# Patient Record
Sex: Female | Born: 2000 | Race: White | Hispanic: No | Marital: Single | Smoking: Never smoker
Health system: Southern US, Community
[De-identification: ages and names within clinical notes are randomized; demographics above are authoritative.]

## PROBLEM LIST (undated history)

## (undated) HISTORY — PX: NO PAST SURGERIES: SHX2092

---
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
## 2021-06-16 ENCOUNTER — Ambulatory Visit: Payer: BC Managed Care – PPO | Admitting: Family Medicine

## 2021-07-19 ENCOUNTER — Other Ambulatory Visit: Payer: Self-pay

## 2021-07-19 ENCOUNTER — Encounter: Payer: Self-pay | Admitting: Family Medicine

## 2021-07-19 ENCOUNTER — Ambulatory Visit: Payer: BC Managed Care – PPO | Admitting: Family Medicine

## 2021-07-19 DIAGNOSIS — F902 Attention-deficit hyperactivity disorder, combined type: Secondary | ICD-10-CM

## 2021-07-19 MED ORDER — AMPHETAMINE-DEXTROAMPHETAMINE 10 MG PO TABS: 10.0000 mg | ORAL_TABLET | Freq: Two times a day (BID) | ORAL | 0 refills | Status: DC

## 2021-07-19 NOTE — Progress Notes (Signed)
? ? Patient ID: Andrea Bender, female    DOB: [birth year], 21 y.o.   MRN: [id number] ? ?This visit was conducted in person. ? ?BP 100/64   Pulse 84   Temp 98.3 ?F (36.8 ?C) (Skin)   Ht 5\' 8"  (1.727 m)   Wt 145 lb 8 oz (66 kg)   LMP 07/18/2021   BMI 22.12 kg/m?   ? ?CC: ADHD f/u visit  ?Subjective:  ? ?HPI: ?Andrea Bender is a 21 y.o. female presenting on 07/19/2021 for Follow-up (On medication) ? ? ?ADHD dx by psychology (Altabet 10/2020) ?Current regimen is adderall IR 10mg  BID. Regularly takes 10mg  BID - more regularly in PM as per below.  ? ?Tolerating medication without headache, anorexia, chest pain, abd pain, tics or mood changes.  ? ?Continues working at 11/2020 by and really likes this.  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 Enjoying this as well.  ? ?Decided to take the year off from college but now planning to take summer classes through [employer] starting this summer.  ?   ? ?Relevant past medical, surgical, family and social history reviewed and updated as indicated. Interim medical history since our last visit reviewed. ?Allergies and medications reviewed and updated. ?Outpatient Medications Prior to Visit  ?Medication Sig Dispense Refill  ? Clindamycin-Benzoyl Per, Refr, gel APPLY TO AFFECTED AREA EVERY DAY    ? JUNEL FE 1.5/30 1.5-30 MG-MCG tablet Take 1 tablet by mouth every morning.    ? Multiple Vitamin (MULTIVITAMIN) tablet Take 1 tablet by mouth daily.    ? tretinoin (RETIN-A) 0.025 % cream APPLY TO AFFECTED AREA EVERY DAY AT NIGHT    ? amphetamine-dextroamphetamine (ADDERALL) 10 MG tablet Take 1 tablet (10 mg total) by mouth 2 (two) times daily as needed (ADHD). 60 tablet 0  ? amphetamine-dextroamphetamine (ADDERALL) 10 MG tablet Take 1 tablet (10 mg total) by mouth 2 (two) times daily as needed (ADHD). 60 tablet 0  ? amphetamine-dextroamphetamine (ADDERALL) 10 MG tablet Take 1 tablet (10 mg total) by mouth 2 (two) times daily as  needed (ADHD). 60 tablet 0  ? ?No facility-administered medications prior to visit.  ?  ? ?Per HPI unless specifically indicated in ROS section below ?Review of Systems ? ?Objective:  ?BP 100/64   Pulse 84   Temp 98.3 ?F (36.8 ?C) (Skin)   Ht 5\' 8"  (1.727 m)   Wt 145 lb 8 oz (66 kg)   LMP 07/18/2021   BMI 22.12 kg/m?   ?Wt Readings from Last 3 Encounters:  ?07/19/21 145 lb 8 oz (66 kg) (75 %, Z= 0.69)*  ?12/11/20 139 lb 8 oz (63.3 kg) (70 %, Z= 0.52)*  ?11/20/20 138 lb 5 oz (62.7 kg) (69 %, Z= 0.49)*  ? ?* Growth percentiles are based on CDC (Girls, 2-20 Years) data.  ?  ?  ?Physical Exam ?Vitals and nursing note reviewed.  ?Constitutional:   ?   Appearance: Normal appearance. She is not ill-appearing.  ?Cardiovascular:  ?   Rate and Rhythm: Normal rate and regular rhythm.  ?   Pulses: Normal pulses.  ?   Heart sounds: Normal heart sounds. No murmur heard. ?Pulmonary:  ?   Effort: Pulmonary effort is normal. No respiratory distress.  ?   Breath sounds: Normal breath sounds. No wheezing, rhonchi or rales.  ?Musculoskeletal:  ?   Right lower leg: No edema.  ?   Left lower leg: No edema.  ?Skin: ?  General: Skin is warm and dry.  ?   Findings: No rash.  ?Neurological:  ?   Mental Status: She is alert.  ?Psychiatric:     ?   Mood and Affect: Mood normal.     ?   Behavior: Behavior normal.  ? ?   ?Results for orders placed or performed in visit on 10/20/20  ?DRUG MONITORING, PANEL 8 WITH CONFIRMATION, URINE  ?Result Value Ref Range  ? Alcohol Metabolites NEGATIVE ng/mL  ? Amphetamines NEGATIVE <500 ng/mL  ? Benzodiazepines NEGATIVE <100 ng/mL  ? Buprenorphine, Urine NEGATIVE <5 ng/mL  ? Cocaine Metabolite NEGATIVE <150 ng/mL  ? 6 Acetylmorphine NEGATIVE <10 ng/mL  ? Marijuana Metabolite NEGATIVE <20 ng/mL  ? MDMA NEGATIVE <500 ng/mL  ? Opiates NEGATIVE <100 ng/mL  ? Oxycodone NEGATIVE <100 ng/mL  ? Creatinine 229.6 mg/dL  ? pH 7.2 4.5 - 9.0  ? Oxidant NEGATIVE mcg/mL  ?DM TEMPLATE  ?Result Value Ref Range  ? Notes  and Comments    ? ? ?Assessment & Plan:  ?This visit occurred during the SARS-CoV-2 public health emergency.  Safety protocols were in place, including screening questions prior to the visit, additional usage of staff PPE, and extensive cleaning of exam room while observing appropriate contact time as indicated for disinfecting solutions.  ? ?Problem List Items Addressed This Visit   ? ? ADHD (attention deficit hyperactivity disorder), combined type  ?  Stable period on adderall IR 10mg  bid, using regularly, tolerating well, and benefiting from medication.  ?She requests sooner visit than 6 months as she will be starting summer classes in a few months and wants to ensure she is on therapeutic dose of stimulant. This is very reasonable. I asked her to return in 3-4 months about 1 wk after starting summer classes.  ?  ?  ?  ? ?Meds ordered this encounter  ?Medications  ? amphetamine-dextroamphetamine (ADDERALL) 10 MG tablet  ?  Sig: Take 1 tablet (10 mg total) by mouth 2 (two) times daily with a meal.  ?  Dispense:  60 tablet  ?  Refill:  0  ? amphetamine-dextroamphetamine (ADDERALL) 10 MG tablet  ?  Sig: Take 1 tablet (10 mg total) by mouth 2 (two) times daily with a meal.  ?  Dispense:  60 tablet  ?  Refill:  0  ? amphetamine-dextroamphetamine (ADDERALL) 10 MG tablet  ?  Sig: Take 1 tablet (10 mg total) by mouth 2 (two) times daily with a meal.  ?  Dispense:  60 tablet  ?  Refill:  0  ? ?No orders of the defined types were placed in this encounter. ? ? ? ?Patient Instructions  ?You are doing well today. Continue adderall 10mg  twice daily.  ?Schedule follow up visit in 3-4 months - around 1 week after starting summer courses to touch base.  ? ?Follow up plan: ?Return in about 3 months (around 10/19/2021) for follow up visit. ? ? , MD   ?

## 2021-07-19 NOTE — Patient Instructions (Signed)
You are doing well today. Continue adderall 10mg  twice daily.  ?Schedule follow up visit in 3-4 months - around 1 week after starting summer courses to touch base.  ?

## 2021-07-19 NOTE — Assessment & Plan Note (Addendum)
Stable period on adderall IR 10mg  bid, using regularly, tolerating well, and benefiting from medication.  ?She requests sooner visit than 6 months as she will be starting summer classes in a few months and wants to ensure she is on therapeutic dose of stimulant. This is very reasonable. I asked her to return in 3-4 months about 1 wk after starting summer classes.  ?

## 2021-09-21 IMAGING — DX DG ANKLE COMPLETE 3+V*L*
3 series · 3 of 3 positions shown · non-contrast
Comparison: None.

CLINICAL DATA: Left ankle injury, initial encounter. Turned ankle 2
weeks ago.

EXAM:
LEFT ANKLE COMPLETE - 3+ VIEW

[ankle ap]
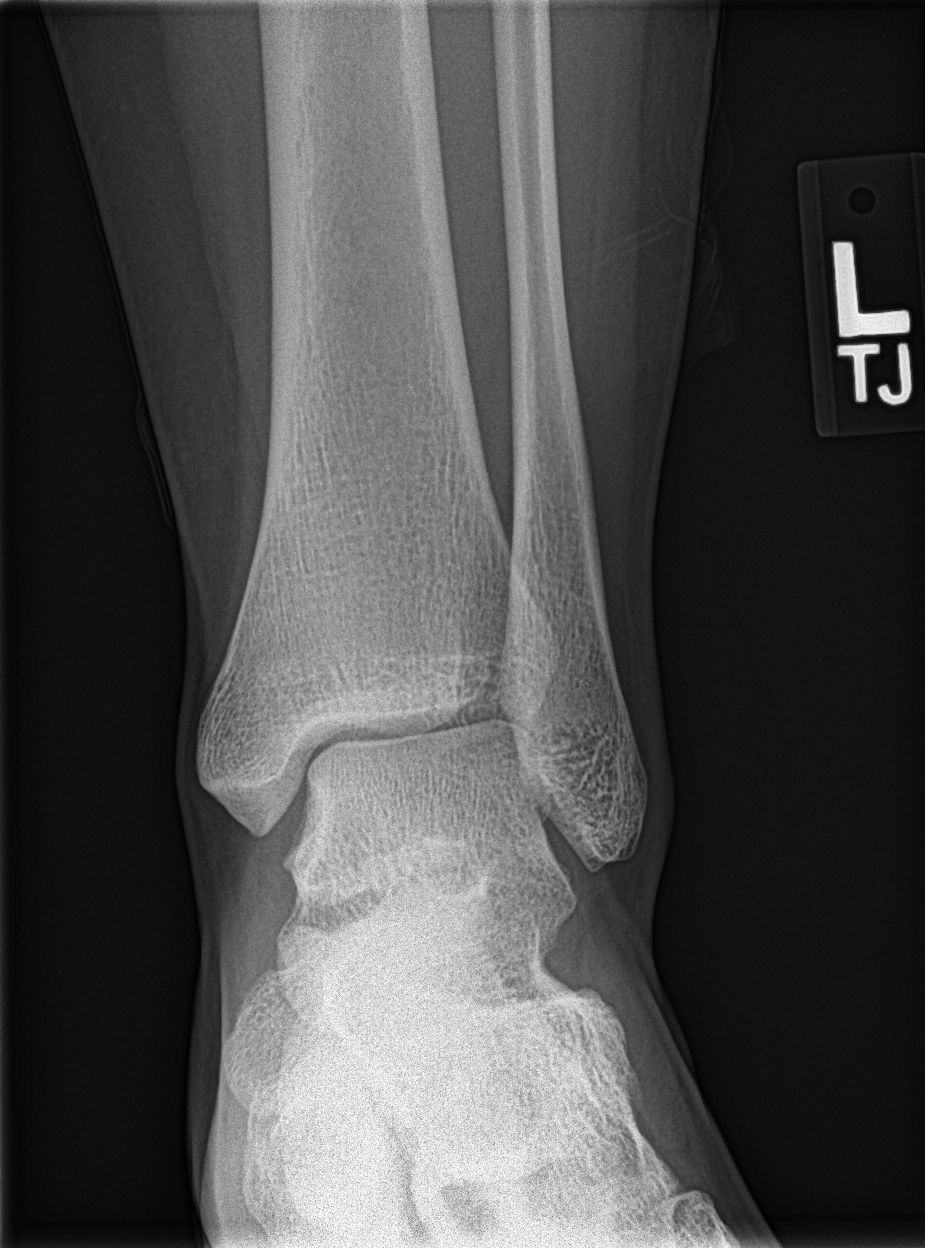

[ankle mortise]
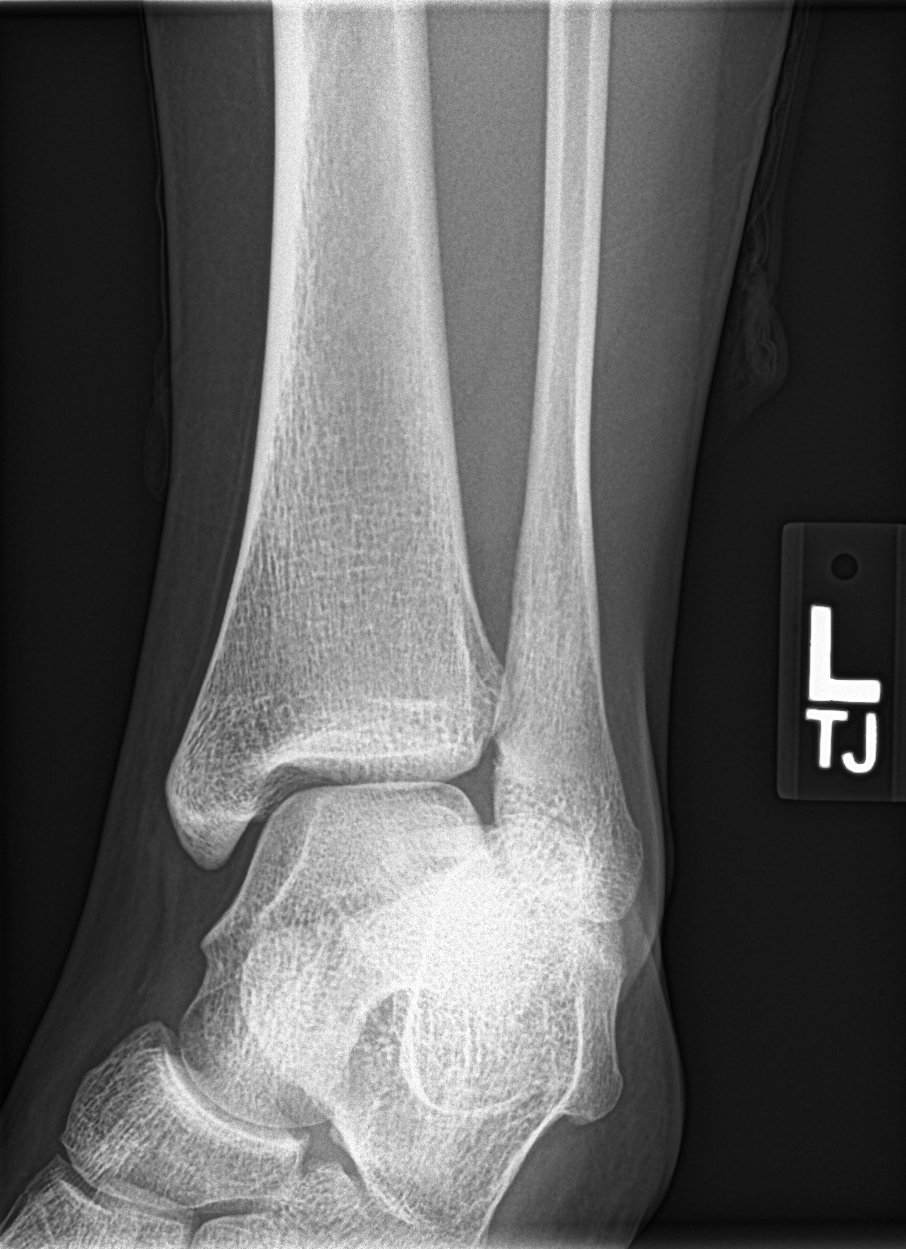

[ankle lat]
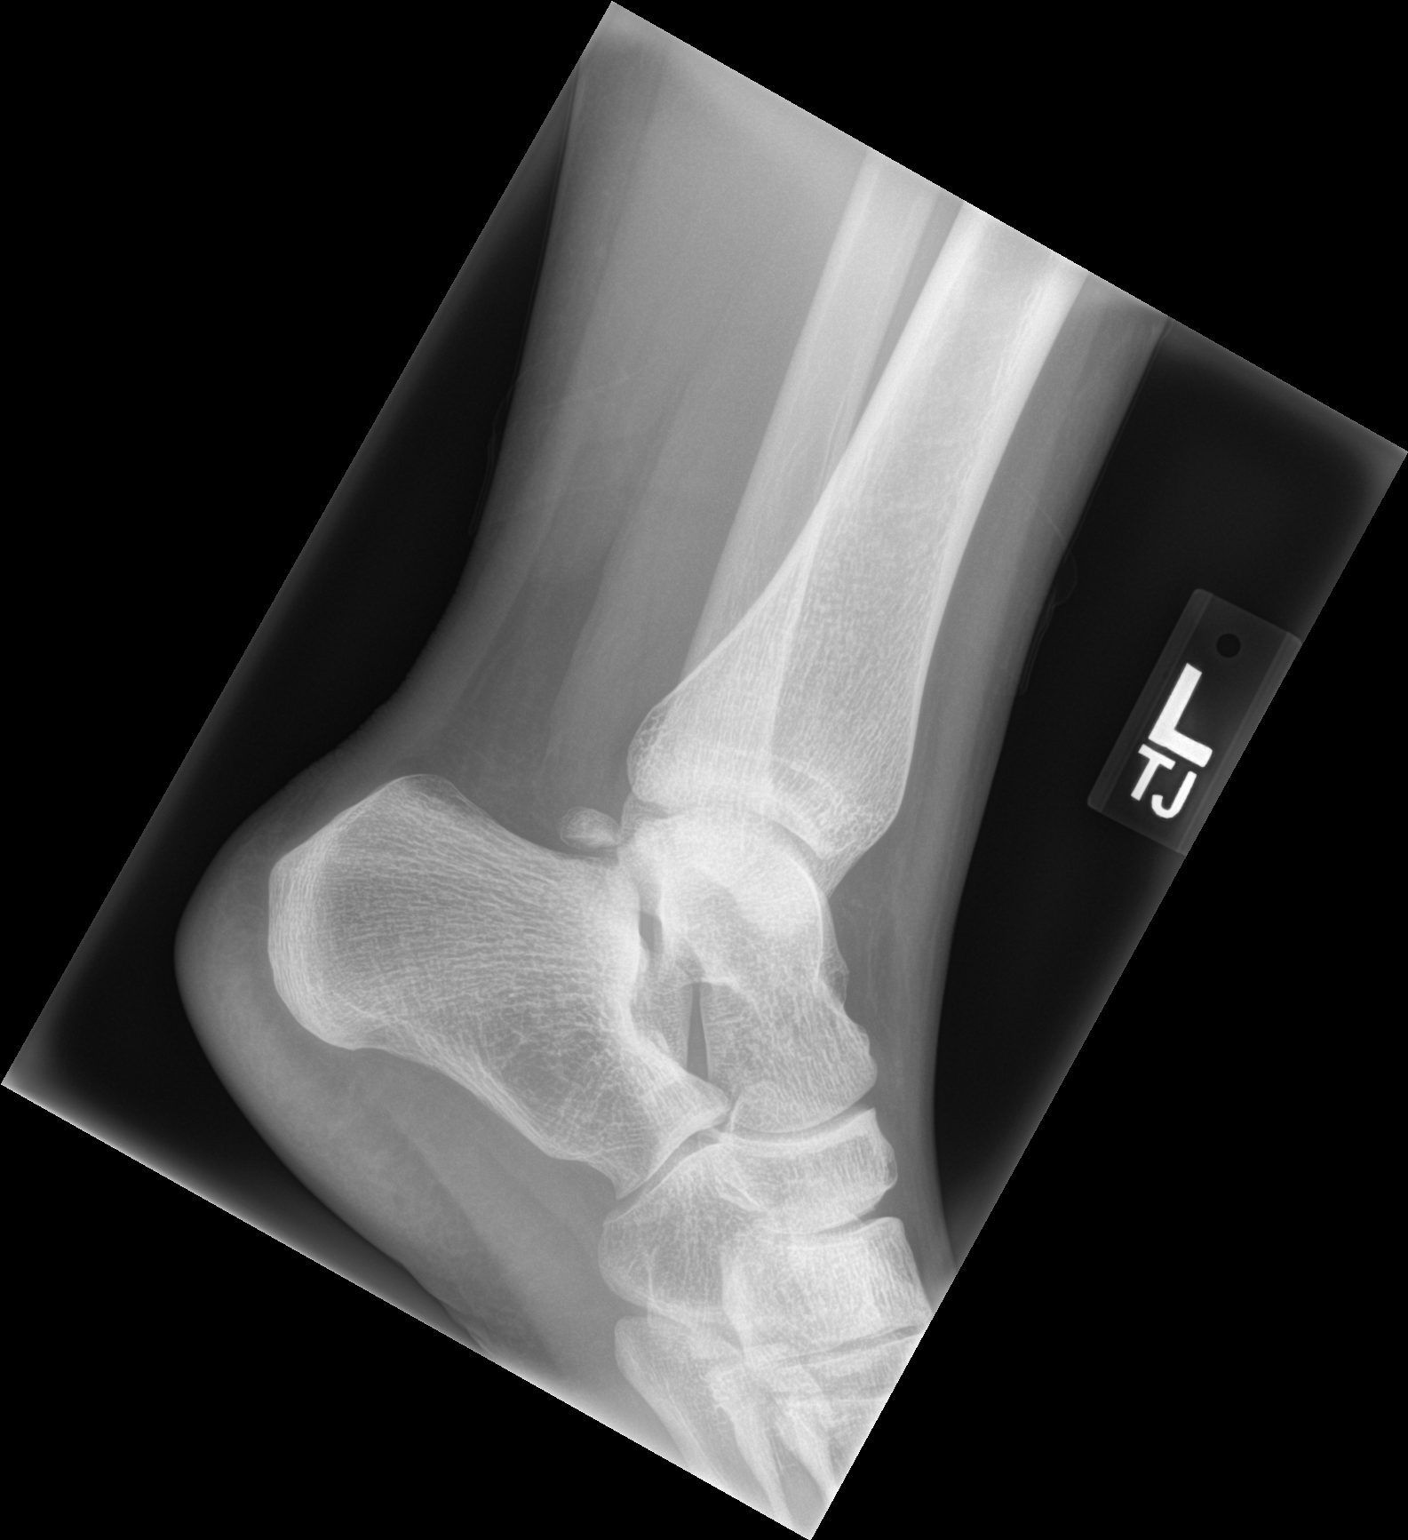

[3 of 3 positions shown; findings below may reference images not displayed]

FINDINGS: There is no evidence of fracture, dislocation, or joint effusion.
There is an os trigonum. There is no evidence of arthropathy or
other focal bone abnormality. The ankle mortise is preserved. Soft
tissues are unremarkable.
IMPRESSION: Normal radiographs of the left ankle.

## 2021-10-25 ENCOUNTER — Ambulatory Visit: Payer: BC Managed Care – PPO | Admitting: Family Medicine

## 2021-10-29 ENCOUNTER — Ambulatory Visit: Payer: BC Managed Care – PPO | Admitting: Family Medicine

## 2021-10-29 ENCOUNTER — Encounter: Payer: Self-pay | Admitting: Family Medicine

## 2021-10-29 DIAGNOSIS — F902 Attention-deficit hyperactivity disorder, combined type: Secondary | ICD-10-CM | POA: Diagnosis not present

## 2021-10-29 MED ORDER — AMPHETAMINE-DEXTROAMPHETAMINE 15 MG PO TABS: 15.0000 mg | ORAL_TABLET | Freq: Two times a day (BID) | ORAL | 0 refills | Status: DC | PRN

## 2022-01-24 ENCOUNTER — Other Ambulatory Visit: Payer: Self-pay

## 2022-01-24 NOTE — Telephone Encounter (Signed)
Name of Medication: adderall 15 mg Name of Pharmacy: [facility] Last Fill or Written Date and Quantity: # 45 on 10/29/21 Last Office Visit and Type: 10/29/21 ADHD Next Office Visit and Type: 01/31/22 for 3 mth FU Last Controlled Substance Agreement Date: 10/23/2020 Last UDS:10/20/20

## 2022-01-26 MED ORDER — AMPHETAMINE-DEXTROAMPHETAMINE 15 MG PO TABS: 15.0000 mg | ORAL_TABLET | Freq: Two times a day (BID) | ORAL | 0 refills | Status: DC | PRN

## 2022-01-26 NOTE — Telephone Encounter (Signed)
ERx 

## 2022-01-27 NOTE — Telephone Encounter (Signed)
Unable to reach pt by phone. Sending note to [facility].

## 2022-01-31 ENCOUNTER — Encounter: Payer: Self-pay | Admitting: Family Medicine

## 2022-01-31 ENCOUNTER — Ambulatory Visit: Payer: BC Managed Care – PPO | Admitting: Family Medicine

## 2022-01-31 VITALS — BP 116/68 | HR 88 | Temp 98.2°F | Ht 68.0 in | Wt 140.1 lb

## 2022-01-31 DIAGNOSIS — F902 Attention-deficit hyperactivity disorder, combined type: Secondary | ICD-10-CM

## 2022-01-31 MED ORDER — AMPHETAMINE-DEXTROAMPHETAMINE 15 MG PO TABS: 15.0000 mg | ORAL_TABLET | Freq: Every day | ORAL | 0 refills | Status: DC

## 2022-01-31 NOTE — Assessment & Plan Note (Signed)
Chronic, stable period on current regimen of adderall 15mg  BID PRN. Takes BID on week days and usually only once daily on weekends. Tolerating well without significant side effects. Will continue this, 3 months worth of medication sent to pharmacy.  RTC 6 mo for physical. Consider updated UDS at that time.

## 2022-01-31 NOTE — Patient Instructions (Addendum)
You are doing well today  Continue adderall 15mg  twice daily as needed - 3 month supply available at pharmacy.  Return as needed or in 6 months for physical.

## 2022-01-31 NOTE — Progress Notes (Signed)
Patient ID: Andrea Bender, female    DOB: 2000/07/02, 21 y.o.   MRN: [id number]  This visit was conducted in person.  BP 116/68   Pulse 88   Temp 98.2 F (36.8 C) (Temporal)   Ht 5\' 8"  (1.727 m)   Wt 140 lb 2 oz (63.6 kg)   LMP 01/31/2022   SpO2 96%   BMI 21.31 kg/m    CC: ADHD f/u  Subjective:   HPI: Andrea Bender is a 21 y.o. female presenting on 01/31/2022 for ADHD (Here for 3 mo f/u.)   Diagnosed by psychological testing (Altabet 10/2020) - ADHD combined along with GAD. Rec at that time - consider medication and individual counseling. She previously saw therapist, didn't feel it was effective (may have been a bad match).  On adderall since last year, current dose is 15mg  BID PRN (last refill 01/24/2022). Tolerating medication well without dry mouth, insomnia, irritability, dysphoria, diminished appetite or weight loss, headache, tics.  Took last year off academically, now has restarted school and doing well. Goes to [employer] [place] [employer].   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      Relevant past medical, surgical, family and social history reviewed and updated as indicated. Interim medical history since our last visit reviewed. Allergies and medications reviewed and updated. Outpatient Medications Prior to Visit  Medication Sig Dispense Refill   amphetamine-dextroamphetamine (ADDERALL) 15 MG tablet Take 1 tablet by mouth 2 (two) times daily as needed (ADHD). 60 tablet 0   Clindamycin-Benzoyl Per, Refr, gel APPLY TO AFFECTED AREA EVERY DAY     JUNEL FE 1.5/30 1.5-30 MG-MCG tablet Take 1 tablet by mouth every morning.     Multiple Vitamin (MULTIVITAMIN) tablet Take 1 tablet by mouth daily.     No facility-administered medications prior to visit.     Per HPI unless specifically indicated in ROS section below Review of Systems  Objective:  BP 116/68   Pulse 88   Temp 98.2 F (36.8  C) (Temporal)   Ht 5\' 8"  (1.727 m)   Wt 140 lb 2 oz (63.6 kg)   LMP 01/31/2022   SpO2 96%   BMI 21.31 kg/m   Wt Readings from Last 3 Encounters:  01/31/22 140 lb 2 oz (63.6 kg)  10/29/21 147 lb (66.7 kg)  07/19/21 145 lb 8 oz (66 kg) (75 %, Z= 0.69)*   * Growth percentiles are based on CDC (Girls, 2-20 Years) data.      Physical Exam Vitals and nursing note reviewed.  Constitutional:      Appearance: Normal appearance. She is not ill-appearing.  HENT:     Head: Normocephalic and atraumatic.     Mouth/Throat:     Mouth: Mucous membranes are moist.     Pharynx: Oropharynx is clear. No oropharyngeal exudate or posterior oropharyngeal erythema.  Eyes:     Extraocular Movements: Extraocular movements intact.     Pupils: Pupils are equal, round, and reactive to light.  Cardiovascular:     Rate and Rhythm: Normal rate and regular rhythm.     Pulses: Normal pulses.     Heart sounds: Normal heart sounds. No murmur heard. Pulmonary:     Effort: Pulmonary effort is normal. No respiratory distress.     Breath sounds: Normal breath sounds. No wheezing, rhonchi or rales.  Musculoskeletal:     Right lower leg: No edema.     Left lower  leg: No edema.  Skin:    General: Skin is warm and dry.     Findings: No rash.  Neurological:     Mental Status: She is alert.  Psychiatric:        Mood and Affect: Mood normal.        Behavior: Behavior normal.        Assessment & Plan:   Problem List Items Addressed This Visit     ADHD (attention deficit hyperactivity disorder), combined type - Primary    Chronic, stable period on current regimen of adderall 15mg  BID PRN. Takes BID on week days and usually only once daily on weekends. Tolerating well without significant side effects. Will continue this, 3 months worth of medication sent to pharmacy.  RTC 6 mo for physical. Consider updated UDS at that time.         Meds ordered this encounter  Medications   amphetamine-dextroamphetamine  (ADDERALL) 15 MG tablet    Sig: Take 1 tablet by mouth daily.    Dispense:  60 tablet    Refill:  0   amphetamine-dextroamphetamine (ADDERALL) 15 MG tablet    Sig: Take 1 tablet by mouth daily.    Dispense:  60 tablet    Refill:  0   No orders of the defined types were placed in this encounter.    Patient Instructions  You are doing well today  Continue adderall 15mg  twice daily as needed - 3 month supply available at pharmacy.  Return as needed or in 6 months for physical.   Follow up plan: Return in about 6 months (around 08/01/2022) for annual exam.  Ria Bush, MD

## 2022-02-03 ENCOUNTER — Other Ambulatory Visit: Payer: Self-pay

## 2022-02-03 NOTE — Telephone Encounter (Signed)
Received faxed message from [facility] stating they received 2 Adderall rxs:  fill on 01/31/22 (pt has picked up) and fill on 03/02/22.  Says they did not receive rx to fill on 04/02/22.  Requests rx be sent again.

## 2022-02-06 MED ORDER — AMPHETAMINE-DEXTROAMPHETAMINE 15 MG PO TABS: 15.0000 mg | ORAL_TABLET | Freq: Every day | ORAL | 0 refills | Status: DC

## 2022-02-09 NOTE — Telephone Encounter (Signed)
Received fas from pharmacy with following information: Pended correction with fill date of 11/25 do we need to d/c the original script for that date?

## 2022-02-10 MED ORDER — AMPHETAMINE-DEXTROAMPHETAMINE 15 MG PO TABS: 15.0000 mg | ORAL_TABLET | Freq: Two times a day (BID) | ORAL | 0 refills | Status: DC | PRN

## 2022-02-10 NOTE — Telephone Encounter (Signed)
ERx new BID PRN dose.

## 2022-06-06 ENCOUNTER — Encounter: Payer: Self-pay | Admitting: Family Medicine

## 2022-06-06 ENCOUNTER — Ambulatory Visit: Payer: BC Managed Care – PPO | Admitting: Family Medicine

## 2022-06-06 VITALS — BP 90/60 | HR 84 | Temp 98.2°F | Ht 68.0 in | Wt 138.0 lb

## 2022-06-06 DIAGNOSIS — M67951 Unspecified disorder of synovium and tendon, right thigh: Secondary | ICD-10-CM | POA: Diagnosis not present

## 2022-06-06 DIAGNOSIS — M67952 Unspecified disorder of synovium and tendon, left thigh: Secondary | ICD-10-CM

## 2022-06-06 DIAGNOSIS — G8929 Other chronic pain: Secondary | ICD-10-CM

## 2022-06-06 DIAGNOSIS — M25552 Pain in left hip: Secondary | ICD-10-CM | POA: Diagnosis not present

## 2022-06-06 DIAGNOSIS — M25551 Pain in right hip: Secondary | ICD-10-CM | POA: Diagnosis not present

## 2022-06-06 MED ORDER — DICLOFENAC SODIUM 75 MG PO TBEC: 75.0000 mg | DELAYED_RELEASE_TABLET | Freq: Two times a day (BID) | ORAL | 1 refills | Status: DC

## 2022-06-06 NOTE — Progress Notes (Unsigned)
Andrea Bo T. Andrea Huckeba, MD, [place] at [facility] [place], [address]  Phone: [phone]  FAX: [phone]  Andrea Bender - 22 y.o. 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 very pleasant female patient with Body mass index is 20.98 kg/m. who presents with the following:  Pleasant patient presents with hip pain.  I saw her about 2 and half years ago with hip pain, she presents today with hip pain.  At the [place] the last time I thought she could have a labral pathology, but today she does not have any anterior groin pain or groin pain with rotational maneuvers.  I did give her some home rehab, and at that point I have not seen her again.  She is not entirely sure about what is going on with her hip.  Did not bother her at all in December.  A few weeks ago, she had some severe pain.  It has been variable, affecting both the right and left hip.  At times it is severe, at times is not present at all.  It will switch from R and L.  Walks with a limp most of the time.  Sitting down will hurt some.   Walking around does help.  Has been ongoing for a year and a half.  DId not bother her until after she stopped dancing.    Will take 2 ibuprofen at times at work.   She is not particularly physically active currently.  Review of Systems is noted in the HPI, as appropriate  Objective:   BP 90/60   Pulse 84   Temp 98.2 F (36.8 C) (Temporal)   Ht 5\' 8"  (1.727 m)   Wt 138 lb (62.6 kg)   LMP 05/28/2022   SpO2 98%   BMI 20.98 kg/m   GEN: No acute distress; alert,appropriate. PULM: Breathing comfortably in no respiratory distress PSYCH: Normally interactive.    HIP EXAM: SIDE:  B ROM: Abduction, Flexion, Internal and External range of motion: Full Pain with terminal IROM and EROM: Modest tenderness at the lateral aspect of the hip caudal to the trochanteric bursa with terminal motion.  There is no groin pain. FADIR is negative GTB: NT, minimally tender on the right SLR: NEG Knees: No effusion FABER: NT REVERSE FABER: NT, neg Piriformis: NT at direct palpation Str: flexion: 5/5 abduction: 5/5 adduction: 5/5 Strength testing non-tender    Laboratory and Imaging Data:  Assessment and Plan:     ICD-10-CM   1. Chronic hip pain, bilateral  M25.551 Ambulatory referral to Physical Therapy   M25.552    G89.29     2. Tendinopathy of right gluteus medius  M67.951 Ambulatory referral to Physical Therapy    3. Tendinopathy of left gluteus medius  M67.952 Ambulatory referral to Physical Therapy     Acute on chronic hip pain with exacerbation.  Think that this is predominantly gluteus medius and gluteus minimus chronic tendinopathy.  It localizes very much to this on exam.  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  I gave her a basic home rehab program to start now, and I am also going  to have her do some formal physical therapy.  Medication Management during today's office visit: Meds ordered this encounter  Medications   diclofenac (VOLTAREN) 75 MG EC tablet    Sig: Take 1 tablet (75 mg total) by mouth 2 (two) times daily.    Dispense:  60 tablet    Refill:  1   Medications Discontinued During This Encounter  Medication Reason   JUNEL FE 1.5/30 1.5-30 MG-MCG tablet Completed Course    Orders placed today for conditions managed today: Orders Placed This Encounter  Procedures   Ambulatory referral to Physical Therapy    Disposition: No follow-ups on file.  Dragon [employer] One speech-to-text software was used for transcription in this dictation.  Possible transcriptional errors can occur  using Editor, commissioning.   Signed,  Andrea Deed. Sheena Donegan, MD   Outpatient Encounter Medications as of 06/06/2022  Medication Sig   amphetamine-dextroamphetamine (ADDERALL) 15 MG tablet Take 1 tablet by mouth 2 (two) times daily as needed (ADHD).   amphetamine-dextroamphetamine (ADDERALL) 15 MG tablet Take 1 tablet by mouth 2 (two) times daily as needed (ADHD).   amphetamine-dextroamphetamine (ADDERALL) 15 MG tablet Take 1 tablet by mouth 2 (two) times daily as needed (ADHD).   Clindamycin-Benzoyl Per, Refr, gel APPLY TO AFFECTED AREA EVERY DAY   diclofenac (VOLTAREN) 75 MG EC tablet Take 1 tablet (75 mg total) by mouth 2 (two) times daily.   Multiple Vitamin (MULTIVITAMIN) tablet Take 1 tablet by mouth daily.   [DISCONTINUED] JUNEL FE 1.5/30 1.5-30 MG-MCG tablet Take 1 tablet by mouth every morning.   No facility-administered encounter medications on file as of 06/06/2022.

## 2022-06-06 NOTE — Patient Instructions (Signed)
Hip Rehab:  Hip Flexion: Toe up to ceiling, laying on your back. Lift your whole leg, 3 sets. Work up to being able to do #30 with each set.  Hip elevations, Toe and leg turned out to side.  Lift whole leg, 3 sets. Work up to being able to do #30 with each set.  Hip Abductions: Lying on side, straight out to side. 3 sets, work up to being able to do #30 with each set.  At the beginning you may only be able to do a lot less, try to do #10.  

## 2022-06-07 ENCOUNTER — Encounter: Payer: Self-pay | Admitting: Family Medicine

## 2022-06-13 ENCOUNTER — Other Ambulatory Visit: Payer: Self-pay

## 2022-06-13 ENCOUNTER — Encounter: Payer: Self-pay | Admitting: Physical Therapy

## 2022-06-13 ENCOUNTER — Ambulatory Visit: Payer: BC Managed Care – PPO | Admitting: Physical Therapy

## 2022-06-13 DIAGNOSIS — M25551 Pain in right hip: Secondary | ICD-10-CM

## 2022-06-13 DIAGNOSIS — M6281 Muscle weakness (generalized): Secondary | ICD-10-CM

## 2022-06-13 DIAGNOSIS — R262 Difficulty in walking, not elsewhere classified: Secondary | ICD-10-CM | POA: Diagnosis not present

## 2022-06-13 DIAGNOSIS — M25552 Pain in left hip: Secondary | ICD-10-CM

## 2022-06-13 NOTE — Therapy (Addendum)
OUTPATIENT PHYSICAL THERAPY LOWER EXTREMITY EVALUATION DISCHARGE   Patient Name: Andrea Bender MRN: [id number] DOB:[birth year], 22 y.o., female Today's Date: 06/13/2022  END OF SESSION:  PT End of Session - 06/13/22 0818     Visit Number 1    Number of Visits 10    Date for PT Re-Evaluation 08/26/22    PT Start Time 0805    PT Stop Time 0845    PT Time Calculation (min) 40 min    Activity Tolerance Patient tolerated treatment well    Behavior During Therapy [facility] for tasks assessed/performed             Past Medical History:  Diagnosis Date   ADHD (attention deficit hyperactivity disorder), combined type 04/29/2020   Underwent psychological testing (Altabet 10/2020) - ADHD, combined presentation, along with GAD.  Recommendation: consider medication, consider individual counseling UDS WNL 10/2020   Past Surgical History:  Procedure Laterality Date   NO PAST SURGERIES     Patient Active Problem List   Diagnosis Date Noted   ADHD (attention deficit hyperactivity disorder), combined type 04/29/2020   GAD (generalized anxiety disorder) 04/29/2020   Acute right hip pain 11/06/2019   Plantar wart of both feet 10/17/2017   Well adolescent visit without abnormal findings 07/19/2013   Thoracic back pain 07/19/2013   Bilateral low frequency hearing loss 07/19/2013    PCP:  Eustaquio Boyden, MD   REFERRING PROVIDER:  Hannah Beat, MD   REFERRING DIAG: M25.551,M25.552,G89.29 (ICD-10-CM) - Chronic hip pain, bilateral M67.951 (ICD-10-CM) - Tendinopathy of right gluteus medius M67.952 (ICD-10-CM) - Tendinopathy of left gluteus medius  THERAPY DIAG:  Pain in left hip  Pain in right hip  Muscle weakness (generalized)  Difficulty in walking, not elsewhere classified  Rationale for Evaluation and Treatment: Rehabilitation  ONSET DATE: ongoing for about 18 months  SUBJECTIVE:   SUBJECTIVE STATEMENT: Pt arriving today with bilateral hip pain. Pt stating some times she  can take a step and her leg will buckle. Pt stating sitting is worse. Pt stating going up stairs at times is painful and she has to use the rail to help assist. Pt states walking makes it better. Pt stating standing still is worse. 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 Pt stating she limps often and her pain can increase to 10/10 at times. Pt stating lying flat on her stomach makes it better.   PERTINENT HISTORY: ADHD,  Pt has been dancing most of her life  PAIN:  NPRS scale: 2/10, but reports it can get up to 10 at times Pain location: bilateral hips Pain description: achy, throbbing, can be shooting Aggravating factors: sitting long periods, stair climbing Relieving factors: lying prone  PRECAUTIONS: None  WEIGHT BEARING RESTRICTIONS: No  FALLS:  Has patient fallen in last 6 months? No  LIVING ENVIRONMENT: Lives with: lives with their family Lives in: House/apartment Stairs: Yes: Internal: 15 steps; on left going up Has following equipment at home: None  OCCUPATION: works at [employer] and childcare  PLOF: Independent  PATIENT GOALS: "to be able to work without pain"     OBJECTIVE:   DIAGNOSTIC FINDINGS: no imaging found of pt's hips  PATIENT SURVEYS:  06/13/22: FOTO intake:  44%    COGNITION: Overall cognitive status: WFL    SENSATION: WFL  MUSCLE LENGTH: 06/13/22: Hamstrings: Right 94 deg; Left 90 deg  POSTURE: No Significant postural limitations    LOWER  EXTREMITY MMT:   ROM Right Eval 06/13/22 Left eval  Hip flexion 4/5 4/5  Hip extension 5/5 5/5  Hip abduction 4/5 4/5  Hip adduction 4/5 4/5  Hip internal rotation    Hip external rotation    Knee flexion 5/5 5/5  Knee extension 5/5 5/5  Ankle dorsiflexion    Ankle plantarflexion    Ankle inversion    Ankle eversion     (Blank rows = not tested)  LOWER EXTREMITY ROM:  ROM Right Eval 06/13/22 Left Eval 06/13/22  Hip flexion 125  128  Hip extension    Hip abduction 40 44  Hip adduction    Hip internal rotation    Hip external rotation    Knee flexion    Knee extension    Ankle dorsiflexion    Ankle plantarflexion    Ankle inversion    Ankle eversion     (Blank rows = not tested)  LOWER EXTREMITY SPECIAL TESTS:  06/13/22: Hip special tests: [facility]isa [facility]) test: positive Left and negative on right  FUNCTIONAL TESTS:  06/13/22: 5 time sit to stand: 14 seconds no UE support  GAIT:  06/13/22:  Distance walked: 30 feet  Assistive device utilized: None Level of assistance: Complete Independence    TODAY'S TREATMENT                                                                          DATE: 06/13/22:  Therex:    HEP instruction/performance c cues for techniques, handout provided.  Trial set performed of each for comprehension and symptom assessment.  See below for exercise list  PATIENT EDUCATION:  Education details: HEP, POC, DN handout Person educated: Patient Education method: Explanation, Demonstration, Verbal cues, and Handouts Education comprehension: verbalized understanding, returned demonstration, and verbal cues required  HOME EXERCISE PROGRAM: Access Code: [email or web address] URL: [email or web address] Date: 06/13/2022 Prepared by: Narda Amber  Exercises - Supine Bridge  - 2 x daily - 7 x weekly - 2 sets - 10 reps - 5 seonds hold - Supine Lower Trunk Rotation  - 2 x daily - 7 x weekly - 3 reps - 20 seconds hold - Prone Alternating Arm and Leg Lifts  - 2 x daily - 7 x weekly - 2 sets - 10 reps - 3 seconds hold - Supine Figure 4 Piriformis Stretch  - 2 x daily - 7 x weekly - 3 reps - 20 seconds hold - Hooklying Clamshell with Resistance  - 2 x daily - 7 x weekly - 2 sets - 10 reps - 3 seconds hold  ASSESSMENT:  CLINICAL IMPRESSION: Patient is a 22 y.o. who comes to clinic with complaints of Rt and left hip pain with mobility, strength and movement coordination deficits that impair  their ability to perform usual daily and recreational functional activities without increase difficulty/symptoms at this time.  Patient to benefit from skilled PT services to address impairments and limitations to improve to previous level of function without restriction secondary to condition.   OBJECTIVE IMPAIRMENTS: decreased balance, decreased mobility, difficulty walking, decreased ROM, decreased strength, and pain.   ACTIVITY LIMITATIONS: bending, sitting, standing, squatting, sleeping, and stairs  PARTICIPATION LIMITATIONS: cleaning, laundry, community activity, and occupation  PERSONAL FACTORS:  chronic issue ongoing for the last 18 months and pt was seen around 2 years ago by orthopedist  are also affecting patient's functional outcome.   REHAB POTENTIAL: Good  CLINICAL DECISION MAKING: Stable/uncomplicated  EVALUATION COMPLEXITY: Low   GOALS: Goals reviewed with patient? Yes  SHORT TERM GOALS: (target date for Short term goals are 3 weeks 07/08/22)   1.  Patient will demonstrate independent use of home exercise program to maintain progress from in clinic treatments.  Goal status: New  LONG TERM GOALS: (target dates for all long term goals are 10 weeks  08/26/22 )   1. Patient will demonstrate/report pain at worst less than or equal to 2/10 to facilitate minimal limitation in daily activity secondary to pain symptoms.  Goal status: New   2. Patient will demonstrate independent use of home exercise program to facilitate ability to maintain/progress functional gains from skilled physical therapy services.  Goal status: New   3. Patient will demonstrate FOTO outcome > or = 57 % to indicate reduced disability due to condition.  Goal status: New   4.  Patient will demonstrate bilateral LE MMT 5/5 throughout to faciltiate usual transfers, stairs, squatting at [facility] for daily life.   Goal status: New   5.  Patient will demonstrate Goal status: New   6.  Pt will be able to  report working full shift at [employer] with bilateral hip pain </= 2/10.  Goal status: New   7.  Pt will be able to climb 1 flight of stairs with single UE support with step over step gait pattern with no pain in bilateral hips.  Goal Status: New   PLAN:  PT FREQUENCY: 1-2x/week  PT DURATION: 10 weeks  PLANNED INTERVENTIONS: Therapeutic exercises, Therapeutic activity, Neuro Muscular re-education, Balance training, Gait training, Patient/Family education, Joint mobilization, Stair training, DME instructions, Dry Needling, Electrical stimulation, Traction, Cryotherapy, vasopneumatic deviceMoist heat, Taping, Ultrasound, Ionotophoresis 4mg /ml Dexamethasone, and Manual therapy.  All included unless contraindicated  PLAN FOR NEXT SESSION: Review HEP knowledge/results, hip ROM, consider DN    Sharmon Leyden, PT, MPT 06/13/2022, 12:40 PM PHYSICAL THERAPY DISCHARGE SUMMARY  Visits from Start of Care: 1  Current functional level related to goals / functional outcomes: See above   Remaining deficits: See above   Education / Equipment: HEP   Patient agrees to discharge. Patient goals were not met. Patient is being discharged due to not returning since the last visit.

## 2022-06-28 ENCOUNTER — Encounter: Payer: BC Managed Care – PPO | Admitting: Physical Therapy

## 2022-06-28 ENCOUNTER — Telehealth: Payer: Self-pay | Admitting: Physical Therapy

## 2022-06-28 NOTE — Telephone Encounter (Signed)
I spoke to pt after she didn't show for her 8:00 PT appointment. Pt stating she is stuck out of town and just forgot to call to cancel. Pt was reminded of her next appointment on Tuesday 07/05/22 at 8:00. Pt stating things have been going much better since her last visit.   Kearney Hard, PT, MPT 06/28/22 8:21 AM

## 2022-07-05 ENCOUNTER — Encounter: Payer: BC Managed Care – PPO | Admitting: Physical Therapy

## 2022-07-05 ENCOUNTER — Telehealth: Payer: Self-pay | Admitting: Physical Therapy

## 2022-07-05 NOTE — Telephone Encounter (Signed)
I called pt to follow up after she didn't show up for her 8:00 PT appointment today. Pt currently has no further appointment scheduled so I left our clinic number ([phone]) for pt to reschedule if needed.   Kearney Hard, PT, MPT 07/05/22 8:24 AM

## 2022-07-05 NOTE — Progress Notes (Deleted)
    Etoile Looman T. Daquon Greenleaf, MD, [place] at [facility] [place], [address]  Phone: [phone]  FAX: [phone]  Bessye Zipp - 22 y.o. female  MRN [id number]  Date of Birth: [birth year]  Date: 07/06/2022  PCP: Ria Bush, MD  Referral: Ria Bush, MD  No chief complaint on file.  Subjective:   Lezette Wenk is a 22 y.o. very pleasant female patient with There is no height or weight on file to calculate BMI. who presents with the following:  She was here roughly 1 month ago, and at that point I felt like she was having some bilateral gluteus medius and minimus tendinopathy.  I did give her home rehab program and have her started on some physical therapy.  Also gave her some oral diclofenac to take.    Review of Systems is noted in the HPI, as appropriate  Objective:   LMP 05/28/2022   GEN: No acute distress; alert,appropriate. PULM: Breathing comfortably in no respiratory distress PSYCH: Normally interactive.   Laboratory and Imaging Data:  Assessment and Plan:   ***

## 2022-07-06 ENCOUNTER — Ambulatory Visit: Payer: BC Managed Care – PPO | Admitting: Family Medicine

## 2022-07-06 DIAGNOSIS — M67952 Unspecified disorder of synovium and tendon, left thigh: Secondary | ICD-10-CM

## 2022-07-06 DIAGNOSIS — M67951 Unspecified disorder of synovium and tendon, right thigh: Secondary | ICD-10-CM

## 2022-07-06 DIAGNOSIS — G8929 Other chronic pain: Secondary | ICD-10-CM

## 2022-07-10 NOTE — Progress Notes (Unsigned)
    Andrea Gavilanes T. Andrea Warmuth, MD, [place] at [facility] [place], [address]  Phone: [phone]  FAX: [phone]  Andrea Bender - 22 y.o. female  MRN [id number]  Date of Birth: 2000/06/07  Date: 07/11/2022  PCP: Ria Bush, MD  Referral: Ria Bush, MD  No chief complaint on file.  Subjective:   Andrea Bender is a 22 y.o. very pleasant female patient with There is no height or weight on file to calculate BMI. who presents with the following:  She is a pleasant patient who I remember well over the years, she presents with follow-up on bilateral hip pain.  She predominantly on her last office visit was having lateral hip pain caudal to the trochanteric bursa, and I felt like she was having some gluteus medius and minimus tendinopathy.  She is here today to follow-up about this.  She has done a few visits of formal physical therapy.    06/06/2022 Last OV with Owens Loffler, MD  Pleasant patient presents with hip pain.   I saw her about 2 and half years ago with hip pain, she presents today with hip pain.  At the [place] the last time I thought she could have a labral pathology, but today she does not have any anterior groin pain or groin pain with rotational maneuvers.   I did give her some home rehab, and at that point I have not seen her again.   She is not entirely sure about what is going on with her hip.  Did not bother her at all in December.  A few weeks ago, she had some severe pain.  It has been variable, affecting both the right and left hip.  At times it is severe, at times is not present at all.   It will switch from R and L.  Walks with a limp most of the time.  Sitting down will hurt some.    Walking around does help.  Has been ongoing for a year and a half.  DId not bother her until after she stopped dancing.     Will take 2 ibuprofen at times at work.    She is not particularly physically  active currently.  Review of Systems is noted in the HPI, as appropriate  Objective:   There were no vitals taken for this visit.  GEN: No acute distress; alert,appropriate. PULM: Breathing comfortably in no respiratory distress PSYCH: Normally interactive.   Laboratory and Imaging Data:  Assessment and Plan:   ***

## 2022-07-11 ENCOUNTER — Ambulatory Visit: Payer: BC Managed Care – PPO | Admitting: Family Medicine

## 2022-07-11 ENCOUNTER — Encounter: Payer: Self-pay | Admitting: Family Medicine

## 2022-07-11 VITALS — BP 90/60 | HR 82 | Temp 98.1°F | Ht 68.0 in | Wt 135.1 lb

## 2022-07-11 DIAGNOSIS — M25551 Pain in right hip: Secondary | ICD-10-CM | POA: Diagnosis not present

## 2022-07-11 DIAGNOSIS — M25552 Pain in left hip: Secondary | ICD-10-CM | POA: Diagnosis not present

## 2022-07-11 DIAGNOSIS — M67952 Unspecified disorder of synovium and tendon, left thigh: Secondary | ICD-10-CM | POA: Diagnosis not present

## 2022-07-11 DIAGNOSIS — M67951 Unspecified disorder of synovium and tendon, right thigh: Secondary | ICD-10-CM | POA: Diagnosis not present

## 2022-07-11 DIAGNOSIS — G8929 Other chronic pain: Secondary | ICD-10-CM

## 2022-08-04 ENCOUNTER — Other Ambulatory Visit: Payer: Self-pay | Admitting: Family Medicine

## 2022-08-04 NOTE — Telephone Encounter (Signed)
Last office visit 07/11/2022 with Dr. Lorelei Pont for bilateral hip pain.  Last refilled 06/06/2022 for #60 with 1 refill.  Next Appt: No future appointments.

## 2022-08-08 ENCOUNTER — Ambulatory Visit: Payer: BC Managed Care – PPO | Admitting: Family Medicine

## 2022-10-14 ENCOUNTER — Ambulatory Visit: Payer: BC Managed Care – PPO | Admitting: Family Medicine

## 2022-10-21 ENCOUNTER — Encounter: Payer: Self-pay | Admitting: Family Medicine

## 2022-10-21 ENCOUNTER — Other Ambulatory Visit: Payer: Self-pay | Admitting: Family Medicine

## 2022-10-21 ENCOUNTER — Ambulatory Visit: Payer: BC Managed Care – PPO | Admitting: Family Medicine

## 2022-10-21 VITALS — BP 116/70 | HR 86 | Temp 97.5°F | Ht 68.0 in | Wt 135.2 lb

## 2022-10-21 DIAGNOSIS — Z0000 Encounter for general adult medical examination without abnormal findings: Secondary | ICD-10-CM | POA: Diagnosis not present

## 2022-10-21 DIAGNOSIS — G8929 Other chronic pain: Secondary | ICD-10-CM

## 2022-10-21 DIAGNOSIS — M25551 Pain in right hip: Secondary | ICD-10-CM

## 2022-10-21 DIAGNOSIS — Z113 Encounter for screening for infections with a predominantly sexual mode of transmission: Secondary | ICD-10-CM | POA: Diagnosis not present

## 2022-10-21 DIAGNOSIS — Z13220 Encounter for screening for lipoid disorders: Secondary | ICD-10-CM

## 2022-10-21 DIAGNOSIS — Z136 Encounter for screening for cardiovascular disorders: Secondary | ICD-10-CM

## 2022-10-21 DIAGNOSIS — Z1159 Encounter for screening for other viral diseases: Secondary | ICD-10-CM

## 2022-10-21 DIAGNOSIS — N920 Excessive and frequent menstruation with regular cycle: Secondary | ICD-10-CM

## 2022-10-21 DIAGNOSIS — M25552 Pain in left hip: Secondary | ICD-10-CM

## 2022-10-21 DIAGNOSIS — F902 Attention-deficit hyperactivity disorder, combined type: Secondary | ICD-10-CM

## 2022-10-21 DIAGNOSIS — Z01419 Encounter for gynecological examination (general) (routine) without abnormal findings: Secondary | ICD-10-CM

## 2022-10-21 DIAGNOSIS — Z131 Encounter for screening for diabetes mellitus: Secondary | ICD-10-CM

## 2022-10-21 MED ORDER — AMPHETAMINE-DEXTROAMPHET ER 20 MG PO CP24: 20.0000 mg | ORAL_CAPSULE | Freq: Every day | ORAL | 0 refills | Status: DC

## 2022-10-21 NOTE — Progress Notes (Signed)
Ph: [phone] Fax: [phone]   Patient ID: Andrea Bender, female    DOB: 08-05-2000, 22 y.o.   MRN: [id number]  This visit was conducted in person.  BP 116/70   Pulse 86   Temp (!) 97.5 F (36.4 C) (Temporal)   Ht 5\' 8"  (1.727 m)   Wt 135 lb 4 oz (61.3 kg)   LMP 10/02/2022   SpO2 98%   BMI 20.56 kg/m    CC: CPE Subjective:   HPI: Andrea Bender is a 22 y.o. 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 Managed with adderall 15mg  BID PRN. Tolerating med well however notes worsening memory difficulty worse at work. First dose 7:30am, second dose 1:30pm. Weight drop noted 5 lbs since 01/2022.   Took a year off from college, now back at [employer] campus this past year. Studying gen studies at [employer]. Wants to start at [employer].   Continues working at [employer]. 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   Preventative: Colon cancer screening - not due Prostate cancer screening - not due Breast cancer screening - not due Well woman exam - discussed - refer to GYN.  LMP - 10/02/2022, monthly and regular, goes through a box of pads per cycle, heavy bleeding first 2 days DEXA scan - not due  Flu shot - occ COVID shot - pfizer Tdap 12/2012  Pneumonia shot - not due Shingrix - not due Advanced directive discussion -  Seat belt use discussed. No texting and driving.  Sunscreen use discussed. No changing moles on skin.  Smoking - none  Alcohol - social  Rec drugs - none  Mood - denies depressed mood.  Sexual activity - females. Monogamous relationship with GF for the past 10 months. 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 Interim medical history since our last visit  reviewed. Allergies and medications reviewed and updated. Outpatient Medications Prior to Visit  Medication Sig Dispense Refill  . amphetamine-dextroamphetamine (ADDERALL) 15 MG tablet Take 1 tablet by mouth 2 (two) times daily as needed (ADHD). 60 tablet 0  . amphetamine-dextroamphetamine (ADDERALL) 15 MG tablet Take 1 tablet by mouth 2 (two) times daily as needed (ADHD). 60 tablet 0  . amphetamine-dextroamphetamine (ADDERALL) 15 MG tablet Take 1 tablet by mouth 2 (two) times daily as needed (ADHD). 60 tablet 0  . Clindamycin-Benzoyl Per, Refr, gel APPLY TO AFFECTED AREA EVERY DAY    . diclofenac (VOLTAREN) 75 MG EC tablet TAKE 1 TABLET BY MOUTH TWICE A DAY 60 tablet 2  . Multiple Vitamin (MULTIVITAMIN) tablet Take 1 tablet by mouth daily.     No facility-administered medications prior to visit.     Per HPI unless specifically indicated in ROS section below Review of Systems  Constitutional:  Negative for activity change, appetite change, chills, fatigue, fever and unexpected weight change.  HENT:  Negative for hearing loss.   Eyes:  Negative for visual disturbance.  Respiratory:  Negative for cough, chest tightness, shortness of breath and wheezing.   Cardiovascular:  Negative for chest pain, palpitations and leg swelling.  Gastrointestinal:  Negative for abdominal distention, abdominal pain, blood in stool, constipation, diarrhea, nausea and vomiting.  Genitourinary:  Negative for difficulty urinating and hematuria.  Musculoskeletal:  Negative for arthralgias, myalgias and neck pain.  Skin:  Negative for rash.  Neurological:  Positive for dizziness (orthostatic). Negative for seizures, syncope and headaches.  Hematological:  Negative for adenopathy. Does not bruise/bleed easily.  Psychiatric/Behavioral:  Negative for dysphoric mood. The patient is not nervous/anxious.     Objective:  BP 116/70   Pulse 86   Temp (!) 97.5 F (36.4 C) (Temporal)   Ht 5\' 8"  (1.727 m)   Wt 135 lb 4  oz (61.3 kg)   LMP 10/02/2022   SpO2 98%   BMI 20.56 kg/m   Wt Readings from Last 3 Encounters:  10/21/22 135 lb 4 oz (61.3 kg)  07/11/22 135 lb 2 oz (61.3 kg)  06/06/22 138 lb (62.6 kg)    Ht Readings from Last 3 Encounters:  10/21/22 5\' 8"  (1.727 m)  07/11/22 5\' 8"  (1.727 m)  06/06/22 5\' 8"  (1.727 m)      Physical Exam Vitals and nursing note reviewed.  Constitutional:      Appearance: Normal appearance. She is not ill-appearing.  HENT:     Head: Normocephalic and atraumatic.     Right Ear: Tympanic membrane, ear canal and external ear normal. There is no impacted cerumen.     Left Ear: Tympanic membrane, ear canal and external ear normal. There is no impacted cerumen.     Nose: Nose normal.     Mouth/Throat:     Mouth: Mucous membranes are moist.     Pharynx: Oropharynx is clear. No oropharyngeal exudate or posterior oropharyngeal erythema.  Eyes:     General:        Right eye: No discharge.        Left eye: No discharge.     Extraocular Movements: Extraocular movements intact.     Conjunctiva/sclera: Conjunctivae normal.     Pupils: Pupils are equal, round, and reactive to light.  Neck:     Thyroid: No thyroid mass or thyromegaly.  Cardiovascular:     Rate and Rhythm: Normal rate and regular rhythm.     Pulses: Normal pulses.     Heart sounds: Normal heart sounds. No murmur heard. Pulmonary:     Effort: Pulmonary effort is normal. No respiratory distress.     Breath sounds: Normal breath sounds. No wheezing, rhonchi or rales.  Abdominal:     General: Bowel sounds are normal. There is no distension.     Palpations: Abdomen is soft. There is no mass.     Tenderness: There is no abdominal tenderness. There is no guarding or rebound.     Hernia: No hernia is present.  Musculoskeletal:     Cervical back: Normal range of motion and neck supple. No rigidity.     Right lower leg: No edema.     Left lower leg: No edema.  Lymphadenopathy:     Cervical: No cervical  adenopathy.  Skin:    General: Skin is warm and dry.     Findings: No rash.  Neurological:     General: No focal deficit present.     Mental Status: She is alert. Mental status is at baseline.  Psychiatric:        Mood and Affect: Mood normal.        Behavior: Behavior normal.      Results for orders placed or performed in visit on 10/20/20  DRUG MONITORING, PANEL 8 WITH CONFIRMATION, URINE  Result Value Ref Range  Alcohol Metabolites NEGATIVE ng/mL   Amphetamines NEGATIVE <500 ng/mL   Benzodiazepines NEGATIVE <100 ng/mL   Buprenorphine, Urine NEGATIVE <5 ng/mL   Cocaine Metabolite NEGATIVE <150 ng/mL   6 Acetylmorphine NEGATIVE <10 ng/mL   Marijuana Metabolite NEGATIVE <20 ng/mL   MDMA NEGATIVE <500 ng/mL   Opiates NEGATIVE <100 ng/mL   Oxycodone NEGATIVE <100 ng/mL   Creatinine 229.6 mg/dL   pH 7.2 4.5 - 9.0   Oxidant NEGATIVE mcg/mL  DM TEMPLATE  Result Value Ref Range   Notes and Comments     No results found for: "WBC", "HGB", "HCT", "MCV", "PLT"  No results found for: "TSH"  No results found for: "CHOL", "HDL", "LDLCALC", "LDLDIRECT", "TRIG", "CHOLHDL"  No results found for: "CREATININE", "BUN", "NA", "K", "CL", "CO2"     06/06/2022   11:46 AM 04/29/2020   10:40 AM 11/06/2019    4:04 PM  Depression screen PHQ 2/9  Decreased Interest 0 1 1  Down, Depressed, Hopeless 0 2 0  PHQ - 2 Score 0 3 1  Altered sleeping  1   Tired, decreased energy  1   Change in appetite  1   Feeling bad or failure about yourself   2   Trouble concentrating  1   Moving slowly or fidgety/restless  3   PHQ-9 Score  12         No data to display         Assessment & Plan:   Problem List Items Addressed This Visit   None    Meds ordered this encounter  Medications  . amphetamine-dextroamphetamine (ADDERALL XR) 20 MG 24 hr capsule    Sig: Take 1 capsule (20 mg total) by mouth daily. With IR as needed in afternoons    Dispense:  30 capsule    Refill:  0    No orders of  the defined types were placed in this encounter.   Patient Instructions  Trial adderall XR 20mg  in the mornings, may use adderall immediate release in afternoons as needed. Update me via mychart with how you're doing with new medicines.  We will refer you to GYN across the street.  Good to see you today! Return as needed or in 3 months   Follow up plan: Return if symptoms worsen or fail to improve.  Eustaquio Boyden, MD

## 2022-10-21 NOTE — Telephone Encounter (Signed)
Last office visit 07/11/2022 for Hip Pain with Dr. Patsy Lager.  Last refilled 08/04/2022 for 360 with 1 refill.  Next Appt: 10/21/2022 with Dr. Reece Agar to discuss meds.

## 2022-10-21 NOTE — Patient Instructions (Addendum)
Trial adderall XR 20mg  in the mornings, may use adderall immediate release in afternoons as needed. Update me via mychart with how you're doing with new medicines.  We will refer you to GYN across the street.  Good to see you today! Return as needed or in 3 months  Schedule fasting labs at your convenience.

## 2022-10-23 ENCOUNTER — Encounter: Payer: Self-pay | Admitting: Family Medicine

## 2022-10-23 NOTE — Assessment & Plan Note (Addendum)
Preventative protocols reviewed and updated unless pt declined. Discussed healthy diet and lifestyle.  Requests GYN referral to establish for well woman exam. Will refer to [facility].

## 2022-10-23 NOTE — Assessment & Plan Note (Signed)
Seeing sports med for this.

## 2022-10-23 NOTE — Assessment & Plan Note (Addendum)
Chronic, overall stable period however notes worsening difficulty with remembering things despite adderall 15mg  BID regularly.  Will trial adderall XR 20mg  daily in am with 2nd dose in afternoons as needed.  Update with effect over MyChart. RTC 3 mo ADHD fu visit

## 2022-10-23 NOTE — Progress Notes (Incomplete)
Ph: [phone] Fax: [phone]   Patient ID: Andrea Bender, female    DOB: 2000/05/12, 22 y.o.   MRN: [id number]  This visit was conducted in person.  BP 116/70   Pulse 86   Temp (!) 97.5 F (36.4 C) (Temporal)   Ht 5\' 8"  (1.727 m)   Wt 135 lb 4 oz (61.3 kg)   LMP 10/02/2022   SpO2 98%   BMI 20.56 kg/m    CC: CPE Subjective:   HPI: Andrea Bender is a 22 y.o. 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 Managed with adderall 15mg  BID PRN. Tolerating med well however notes worsening memory difficulty specifically retaining information ie orders, worse at work. First Adderall dose 7:30am, second dose 1:30pm. Weight drop noted 5 lbs since 01/2022.   Took a year off from college, now back at [employer] campus this past year. Studying gen studies at [employer]. Wants to start at [employer].   Continues working at [employer] by [employer]. 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   Preventative: Colon cancer screening - not due Breast cancer screening - not due Well woman exam - discussed - refer to GYN.  LMP - 10/02/2022, monthly and regular, goes through a box of pads per cycle, heavy bleeding first 2 days DEXA scan - not due  Flu shot - occ COVID shot - pfizer Tdap 12/2012  Pneumonia shot - not due Shingrix - not due Advanced directive discussion -  Seat belt use discussed. No texting and driving.  Sunscreen use discussed. No changing moles on skin.  Smoking - none  Alcohol - social  Rec drugs - none  Mood - denies depressed mood.  Sexually active - females. Monogamous relationship with GF for the past 10 months. Discussed safe sex, STD screen Dentist - q6 mo Eye exam - yearly   Finished at 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 Interim medical history  since our last visit reviewed. Allergies and medications reviewed and updated. Outpatient Medications Prior to Visit  Medication Sig Dispense Refill  . amphetamine-dextroamphetamine (ADDERALL) 15 MG tablet Take 1 tablet by mouth 2 (two) times daily as needed (ADHD). 60 tablet 0  . Clindamycin-Benzoyl Per, Refr, gel APPLY TO AFFECTED AREA EVERY DAY    . diclofenac (VOLTAREN) 75 MG EC tablet TAKE 1 TABLET BY MOUTH TWICE A DAY 60 tablet 2  . Multiple Vitamin (MULTIVITAMIN) tablet Take 1 tablet by mouth daily.    Marland Kitchen amphetamine-dextroamphetamine (ADDERALL) 15 MG tablet Take 1 tablet by mouth 2 (two) times daily as needed (ADHD). 60 tablet 0  . amphetamine-dextroamphetamine (ADDERALL) 15 MG tablet Take 1 tablet by mouth 2 (two) times daily as needed (ADHD). 60 tablet 0   No facility-administered medications prior to visit.     Per HPI unless specifically indicated in ROS section below Review of Systems  Constitutional:  Negative for activity change, appetite change, chills, fatigue, fever and unexpected weight change.  HENT:  Negative for hearing loss.   Eyes:  Negative for visual disturbance.  Respiratory:  Negative for cough, chest tightness, shortness of breath and wheezing.   Cardiovascular:  Negative for chest pain, palpitations and leg swelling.  Gastrointestinal:  Negative for abdominal distention, abdominal pain, blood in stool, constipation, diarrhea, nausea and vomiting.  Genitourinary:  Negative for difficulty urinating and hematuria.  Musculoskeletal:  Negative for arthralgias, myalgias and neck pain.  Skin:  Negative for rash.  Neurological:  Positive for dizziness (orthostatic). Negative for seizures, syncope and headaches.  Hematological:  Negative for adenopathy. Does not bruise/bleed easily.  Psychiatric/Behavioral:  Negative for dysphoric mood. The patient is not nervous/anxious.     Objective:  BP 116/70   Pulse 86   Temp (!) 97.5 F (36.4 C) (Temporal)   Ht 5\' 8"   (1.727 m)   Wt 135 lb 4 oz (61.3 kg)   LMP 10/02/2022   SpO2 98%   BMI 20.56 kg/m   Wt Readings from Last 3 Encounters:  10/21/22 135 lb 4 oz (61.3 kg)  07/11/22 135 lb 2 oz (61.3 kg)  06/06/22 138 lb (62.6 kg)    Ht Readings from Last 3 Encounters:  10/21/22 5\' 8"  (1.727 m)  07/11/22 5\' 8"  (1.727 m)  06/06/22 5\' 8"  (1.727 m)      Physical Exam Vitals and nursing note reviewed.  Constitutional:      Appearance: Normal appearance. She is not ill-appearing.  HENT:     Head: Normocephalic and atraumatic.     Right Ear: Tympanic membrane, ear canal and external ear normal. There is no impacted cerumen.     Left Ear: Tympanic membrane, ear canal and external ear normal. There is no impacted cerumen.     Nose: Nose normal.     Mouth/Throat:     Mouth: Mucous membranes are moist.     Pharynx: Oropharynx is clear. No oropharyngeal exudate or posterior oropharyngeal erythema.  Eyes:     General:        Right eye: No discharge.        Left eye: No discharge.     Extraocular Movements: Extraocular movements intact.     Conjunctiva/sclera: Conjunctivae normal.     Pupils: Pupils are equal, round, and reactive to light.  Neck:     Thyroid: No thyroid mass or thyromegaly.  Cardiovascular:     Rate and Rhythm: Normal rate and regular rhythm.     Pulses: Normal pulses.     Heart sounds: Normal heart sounds. No murmur heard. Pulmonary:     Effort: Pulmonary effort is normal. No respiratory distress.     Breath sounds: Normal breath sounds. No wheezing, rhonchi or rales.  Abdominal:     General: Bowel sounds are normal. There is no distension.     Palpations: Abdomen is soft. There is no mass.     Tenderness: There is no abdominal tenderness. There is no guarding or rebound.     Hernia: No hernia is present.  Musculoskeletal:     Cervical back: Normal range of motion and neck supple. No rigidity.     Right lower leg: No edema.     Left lower leg: No edema.  Lymphadenopathy:      Cervical: No cervical adenopathy.  Skin:    General: Skin is warm and dry.     Findings: No rash.  Neurological:     General: No focal deficit present.     Mental Status: She is alert. Mental status is at baseline.  Psychiatric:        Mood and Affect: Mood normal.        Behavior: Behavior normal.       Results for orders placed or performed in visit on 10/20/20  DRUG MONITORING, PANEL 8 WITH CONFIRMATION, URINE  Result Value Ref Range  Alcohol Metabolites NEGATIVE ng/mL   Amphetamines NEGATIVE <500 ng/mL   Benzodiazepines NEGATIVE <100 ng/mL   Buprenorphine, Urine NEGATIVE <5 ng/mL   Cocaine Metabolite NEGATIVE <150 ng/mL   6 Acetylmorphine NEGATIVE <10 ng/mL   Marijuana Metabolite NEGATIVE <20 ng/mL   MDMA NEGATIVE <500 ng/mL   Opiates NEGATIVE <100 ng/mL   Oxycodone NEGATIVE <100 ng/mL   Creatinine 229.6 mg/dL   pH 7.2 4.5 - 9.0   Oxidant NEGATIVE mcg/mL  DM TEMPLATE  Result Value Ref Range   Notes and Comments     No results found for: "WBC", "HGB", "HCT", "MCV", "PLT"  No results found for: "TSH"  No results found for: "CHOL", "HDL", "LDLCALC", "LDLDIRECT", "TRIG", "CHOLHDL"  No results found for: "CREATININE", "BUN", "NA", "K", "CL", "CO2"     10/21/2022   12:15 PM 06/06/2022   11:46 AM 04/29/2020   10:40 AM 11/06/2019    4:04 PM  Depression screen PHQ 2/9  Decreased Interest 0 0 1 1  Down, Depressed, Hopeless 0 0 2 0  PHQ - 2 Score 0 0 3 1  Altered sleeping 1  1   Tired, decreased energy 1  1   Change in appetite 1  1   Feeling bad or failure about yourself  0  2   Trouble concentrating 0  1   Moving slowly or fidgety/restless 2  3   Suicidal thoughts 0     PHQ-9 Score 5  12   Difficult doing work/chores Somewhat difficult          10/21/2022   12:15 PM  GAD 7 : Generalized Anxiety Score  Nervous, Anxious, on Edge 1  Control/stop worrying 0  Worry too much - different things 1  Trouble relaxing 0  Restless 1  Easily annoyed or irritable 1   Afraid - awful might happen 0  Total GAD 7 Score 4  Anxiety Difficulty Not difficult at all   Assessment & Plan:   Problem List Items Addressed This Visit     Health maintenance examination - Primary (Chronic)    Preventative protocols reviewed and updated unless pt declined. Discussed healthy diet and lifestyle.       Chronic hip pain, bilateral    Seeing sports med for this.       ADHD (attention deficit hyperactivity disorder), combined type    Chronic, overall stable period however notes worsening difficulty with remembering things despite adderall 15mg  BID regularly.  Will trial adderall XR 20mg  daily in am with 2nd dose in afternoons as needed.  Update with effect over MyChart. RTC 3 mo ADHD fu visit         Meds ordered this encounter  Medications  . amphetamine-dextroamphetamine (ADDERALL XR) 20 MG 24 hr capsule    Sig: Take 1 capsule (20 mg total) by mouth daily. With IR as needed in afternoons    Dispense:  30 capsule    Refill:  0    No orders of the defined types were placed in this encounter.   Patient Instructions  Trial adderall XR 20mg  in the mornings, may use adderall immediate release in afternoons as needed. Update me via mychart with how you're doing with new medicines.  We will refer you to GYN across the street.  Good to see you today! Return as needed or in 3 months  Schedule fasting labs at your convenience.   Follow up plan: Return in about 3 months (around 01/21/2023), or if symptoms worsen or fail to improve,  for follow up visit.  Ria Bush, MD

## 2022-11-16 ENCOUNTER — Other Ambulatory Visit (INDEPENDENT_AMBULATORY_CARE_PROVIDER_SITE_OTHER): Payer: BC Managed Care – PPO

## 2022-11-16 DIAGNOSIS — Z131 Encounter for screening for diabetes mellitus: Secondary | ICD-10-CM

## 2022-11-16 DIAGNOSIS — Z113 Encounter for screening for infections with a predominantly sexual mode of transmission: Secondary | ICD-10-CM

## 2022-11-16 DIAGNOSIS — Z136 Encounter for screening for cardiovascular disorders: Secondary | ICD-10-CM

## 2022-11-16 DIAGNOSIS — Z13220 Encounter for screening for lipoid disorders: Secondary | ICD-10-CM | POA: Diagnosis not present

## 2022-11-16 DIAGNOSIS — Z1159 Encounter for screening for other viral diseases: Secondary | ICD-10-CM

## 2022-11-16 DIAGNOSIS — N920 Excessive and frequent menstruation with regular cycle: Secondary | ICD-10-CM

## 2022-11-16 LAB — CBC WITH DIFFERENTIAL/PLATELET
Basophils Absolute: 0 10*3/uL (ref 0.0–0.1)
Basophils Relative: 0.8 % (ref 0.0–3.0)
Eosinophils Absolute: 0.4 10*3/uL (ref 0.0–0.7)
Eosinophils Relative: 7.2 % — ABNORMAL HIGH (ref 0.0–5.0)
HCT: 34.8 % — ABNORMAL LOW (ref 36.0–46.0)
Hemoglobin: 11.7 g/dL — ABNORMAL LOW (ref 12.0–15.0)
Lymphocytes Relative: 33.1 % (ref 12.0–46.0)
Lymphs Abs: 1.9 10*3/uL (ref 0.7–4.0)
MCHC: 33.7 g/dL (ref 30.0–36.0)
MCV: 89.2 fl (ref 78.0–100.0)
Monocytes Absolute: 0.4 10*3/uL (ref 0.1–1.0)
Monocytes Relative: 7.4 % (ref 3.0–12.0)
Neutro Abs: 3 10*3/uL (ref 1.4–7.7)
Neutrophils Relative %: 51.5 % (ref 43.0–77.0)
Platelets: 297 10*3/uL (ref 150.0–400.0)
RBC: 3.9 Mil/uL (ref 3.87–5.11)
RDW: 12.8 % (ref 11.5–15.5)
WBC: 5.8 10*3/uL (ref 4.0–10.5)

## 2022-11-16 LAB — COMPREHENSIVE METABOLIC PANEL
ALT: 12 U/L (ref 0–35)
AST: 16 U/L (ref 0–37)
Albumin: 4.5 g/dL (ref 3.5–5.2)
Alkaline Phosphatase: 83 U/L (ref 39–117)
BUN: 11 mg/dL (ref 6–23)
CO2: 29 mEq/L (ref 19–32)
Calcium: 9.7 mg/dL (ref 8.4–10.5)
Chloride: 104 mEq/L (ref 96–112)
Creatinine, Ser: 0.75 mg/dL (ref 0.40–1.20)
GFR: 113.94 mL/min (ref >60.00)
Glucose, Bld: 97 mg/dL (ref 70–99)
Potassium: 3.7 mEq/L (ref 3.5–5.1)
Sodium: 140 mEq/L (ref 135–145)
Total Bilirubin: 0.8 mg/dL (ref 0.2–1.2)
Total Protein: 7.1 g/dL (ref 6.0–8.3)

## 2022-11-16 LAB — LIPID PANEL
Cholesterol: 124 mg/dL (ref 0–200)
HDL: 57.1 mg/dL (ref >39.00)
LDL Cholesterol: 59 mg/dL (ref 0–99)
NonHDL: 66.44
Total CHOL/HDL Ratio: 2
Triglycerides: 35 mg/dL (ref 0.0–149.0)
VLDL: 7 mg/dL (ref 0.0–40.0)

## 2022-11-16 LAB — TSH: TSH: 2.76 u[IU]/mL (ref 0.35–5.50)

## 2022-11-17 LAB — HEPATITIS C ANTIBODY: Hepatitis C Ab: NONREACTIVE

## 2022-11-17 LAB — RPR: RPR Ser Ql: NONREACTIVE

## 2022-11-17 LAB — HIV ANTIBODY (ROUTINE TESTING W REFLEX): HIV 1&2 Ab, 4th Generation: NONREACTIVE

## 2022-11-22 ENCOUNTER — Encounter: Payer: Self-pay | Admitting: Family Medicine

## 2022-11-22 DIAGNOSIS — D649 Anemia, unspecified: Secondary | ICD-10-CM | POA: Insufficient documentation

## 2022-12-22 ENCOUNTER — Encounter (INDEPENDENT_AMBULATORY_CARE_PROVIDER_SITE_OTHER): Payer: Self-pay

## 2023-01-24 ENCOUNTER — Ambulatory Visit: Payer: BC Managed Care – PPO | Admitting: Family Medicine

## 2023-01-25 ENCOUNTER — Encounter: Payer: Self-pay | Admitting: Family Medicine

## 2023-03-23 ENCOUNTER — Encounter: Payer: Self-pay | Admitting: Obstetrics & Gynecology

## 2023-03-23 ENCOUNTER — Ambulatory Visit: Payer: BC Managed Care – PPO | Admitting: Obstetrics & Gynecology

## 2023-03-23 ENCOUNTER — Other Ambulatory Visit (HOSPITAL_COMMUNITY)
Admission: RE | Admit: 2023-03-23 | Discharge: 2023-03-23 | Disposition: A | Discharge status: Home / Self Care | Payer: BC Managed Care – PPO | Source: Ambulatory Visit | Attending: Obstetrics & Gynecology | Admitting: Obstetrics & Gynecology

## 2023-03-23 VITALS — BP 106/69 | HR 78 | Ht 69.0 in | Wt 140.0 lb

## 2023-03-23 DIAGNOSIS — N920 Excessive and frequent menstruation with regular cycle: Secondary | ICD-10-CM | POA: Diagnosis not present

## 2023-03-23 DIAGNOSIS — Z01419 Encounter for gynecological examination (general) (routine) without abnormal findings: Secondary | ICD-10-CM | POA: Insufficient documentation

## 2023-03-23 MED ORDER — DROSPIRENONE-ETHINYL ESTRADIOL 3-0.03 MG PO TABS
1.0000 | ORAL_TABLET | Freq: Every day | ORAL | 11 refills | Status: DC
Start: 2023-03-23 — End: 2024-02-21

## 2023-03-23 NOTE — Progress Notes (Signed)
GYNECOLOGY ANNUAL PREVENTATIVE CARE ENCOUNTER NOTE  History:     Andrea Bender is a 22 y.o. G0 female here for a routine annual gynecologic exam.  Current complaints: desires to restart OCPs due to heavy menstrual periods, has used this in the past.   Denies abnormal vaginal bleeding, discharge, pelvic pain, problems with intercourse or other gynecologic concerns.    Gynecologic History Patient's last menstrual period was 03/03/2023 (exact date). Contraception: none Feels she has gotten HPV vaccine series.  Obstetric History OB History  Gravida Para Term Preterm AB Living  0 0 0 0 0 0  SAB IAB Ectopic Multiple Live Births  0 0 0 0 0    Past Medical History:  Diagnosis Date   ADHD (attention deficit hyperactivity disorder), combined type 04/29/2020   Underwent psychological testing (Altabet 10/2020) - ADHD, combined presentation, along with GAD.  Recommendation: consider medication, consider individual counseling UDS WNL 10/2020    Past Surgical History:  Procedure Laterality Date   NO PAST SURGERIES      Current Outpatient Medications on File Prior to Visit  Medication Sig Dispense Refill   amphetamine-dextroamphetamine (ADDERALL XR) 20 MG 24 hr capsule Take 1 capsule (20 mg total) by mouth daily. With IR as needed in afternoons 30 capsule 0   Clindamycin-Benzoyl Per, Refr, gel APPLY TO AFFECTED AREA EVERY DAY     Multiple Vitamin (MULTIVITAMIN) tablet Take 1 tablet by mouth daily.     amphetamine-dextroamphetamine (ADDERALL) 15 MG tablet Take 1 tablet by mouth 2 (two) times daily as needed (ADHD). (Patient not taking: Reported on 03/23/2023) 60 tablet 0   diclofenac (VOLTAREN) 75 MG EC tablet TAKE 1 TABLET BY MOUTH TWICE A DAY (Patient not taking: Reported on 03/23/2023) 60 tablet 2   No current facility-administered medications on file prior to visit.    No Known Allergies  Social History:  reports that she has never smoked. She has never used smokeless tobacco. She  reports that she does not drink alcohol and does not use drugs.  Family History  Problem Relation Age of Onset   Cancer Neg Hx    CAD Neg Hx        remote great grandparent   Stroke Neg Hx        remote great grandparent   Diabetes Neg Hx     The following portions of the patient's history were reviewed and updated as appropriate: allergies, current medications, past family history, past medical history, past social history, past surgical history and problem list.  Review of Systems Pertinent items noted in HPI and remainder of comprehensive ROS otherwise negative.  Physical Exam:  BP 106/69   Pulse 78   Ht 5\' 9"  (1.753 m)   Wt 140 lb (63.5 kg)   LMP 03/03/2023 (Exact Date)   BMI 20.67 kg/m  CONSTITUTIONAL: Well-developed, well-nourished female in no acute distress.  HENT:  Normocephalic, atraumatic, External right and left ear normal.  EYES: Conjunctivae and EOM are normal. Pupils are equal, round, and reactive to light. No scleral icterus.  NECK: Normal range of motion, supple, no masses.  Normal thyroid.  SKIN: Skin is warm and dry. No rash noted. Not diaphoretic. No erythema. No pallor. Multiple tattoos on skins. MUSCULOSKELETAL: Normal range of motion. No tenderness.  No cyanosis, clubbing, or edema. NEUROLOGIC: Alert and oriented to person, place, and time. Normal reflexes, muscle tone coordination.  PSYCHIATRIC: Normal mood and affect. Normal behavior. Normal judgment and thought content. CARDIOVASCULAR: Normal heart rate noted,  regular rhythm RESPIRATORY: Clear to auscultation bilaterally. Effort and breath sounds normal, no problems with respiration noted. BREASTS: Symmetric in size. No masses, tenderness, skin changes, nipple drainage, or lymphadenopathy bilaterally. Performed in the presence of a chaperone. ABDOMEN: Soft, no distention noted.  No tenderness, rebound or guarding.  PELVIC: Normal appearing external genitalia and urethral meatus; normal appearing vaginal  mucosa and cervix.  No abnormal vaginal discharge noted.  Pap smear obtained.  Normal uterine size, no other palpable masses, no uterine or adnexal tenderness.  Performed in the presence of a chaperone.   Assessment and Plan:    1. Menorrhagia with regular cycle Discussed management options for abnormal uterine bleeding including NSAIDs (Naproxen), tranexamic acid (Lysteda), oral hormonal therapy, patch, Depo Provera, Levonogestrel IUD. Discussed risks and benefits of each method.   Patient desires to restart on normal estrogen dose OCPs, low dose option did not work for her in the past.  Yasmin prescribed, will monitor effect.  Will follow up and do BP check in 2 months.  Risks and benefits discussed in detail. - drospirenone-ethinyl estradiol (YASMIN 28) 3-0.03 MG tablet; Take 1 tablet by mouth daily.  Dispense: 28 tablet; Refill: 11  2. Well woman exam with routine gynecological exam - Cytology - PAP Will follow up results of pap smear and manage accordingly. Normal breast examination today, she was advised to perform periodic self breast examinations.  Routine preventative health maintenance measures emphasized. Please refer to After Visit Summary for other counseling recommendations.      Jaynie Collins, MD, FACOG Obstetrician & Gynecologist, [facility] for [employer], [facility]

## 2023-03-24 LAB — CYTOLOGY - PAP
Chlamydia: NEGATIVE
Comment: NEGATIVE
Comment: NEGATIVE
Comment: NEGATIVE
Comment: NORMAL
Diagnosis: NEGATIVE
High risk HPV: NEGATIVE
Neisseria Gonorrhea: NEGATIVE
Trichomonas: NEGATIVE

## 2023-04-12 ENCOUNTER — Ambulatory Visit: Payer: BC Managed Care – PPO | Admitting: Family Medicine

## 2023-04-12 ENCOUNTER — Encounter: Payer: Self-pay | Admitting: Family Medicine

## 2023-04-12 VITALS — BP 106/64 | HR 86 | Temp 98.4°F | Ht 69.0 in | Wt 142.2 lb

## 2023-04-12 DIAGNOSIS — F902 Attention-deficit hyperactivity disorder, combined type: Secondary | ICD-10-CM

## 2023-04-12 MED ORDER — AMPHETAMINE-DEXTROAMPHET ER 20 MG PO CP24: 20.0000 mg | ORAL_CAPSULE | ORAL | 0 refills | Status: DC

## 2023-04-12 MED ORDER — AMPHETAMINE-DEXTROAMPHET ER 20 MG PO CP24: 20.0000 mg | ORAL_CAPSULE | Freq: Every day | ORAL | 0 refills | Status: DC

## 2023-04-12 MED ORDER — AMPHETAMINE-DEXTROAMPHETAMINE 10 MG PO TABS: 10.0000 mg | ORAL_TABLET | Freq: Every day | ORAL | 0 refills | Status: AC | PRN

## 2023-04-12 NOTE — Patient Instructions (Addendum)
You are doing well today  Continue Adderall XR 20mg  in am with 10mg  IR dose as needed in afternoons.  Good to see you Return as needed or in 6 months for physical (after 10/21/2023).

## 2023-04-12 NOTE — Progress Notes (Signed)
Ph: [phone] Fax: [phone]   Patient ID: Andrea Bender, female    DOB: 05/28/2000, 22 y.o.   MRN: [id number]  This visit was conducted in person.  BP 106/64   Pulse 86   Temp 98.4 F (36.9 C) (Oral)   Ht 5\' 9"  (1.753 m)   Wt 142 lb 4 oz (64.5 kg)   LMP 04/01/2023 (Exact Date)   SpO2 99%   BMI 21.01 kg/m    CC: ADHD f/u Subjective:   HPI: Andrea Bender is a 22 y.o. female presenting on 04/12/2023 for Medical Management of Chronic Issues (Here for ADHD f/u.)   Saw GYN 03/2023 - started on Yasmin OCP.   Combined ADHD with GAD - dx by psychology [facility] 10/2020. Managed with adderall XR 20mg  in am with 15mg  IR dose PRN in afternoons.  Tolerating well without headache, chest pain, palpitations, insomnia, anorexia, tics or mood changes.   Continues working at [employer] - planning to leave end of December. Continues working Environmental manager at [employer]  Studying gen studies at [employer] Has applied to [employer].      Relevant past medical, surgical, family and social history reviewed and updated as indicated. Interim medical history since our last visit reviewed. Allergies and medications reviewed and updated. Outpatient Medications Prior to Visit  Medication Sig Dispense Refill   Clindamycin-Benzoyl Per, Refr, gel APPLY TO AFFECTED AREA EVERY DAY     diclofenac (VOLTAREN) 75 MG EC tablet TAKE 1 TABLET BY MOUTH TWICE A DAY 60 tablet 2   drospirenone-ethinyl estradiol (YASMIN 28) 3-0.03 MG tablet Take 1 tablet by mouth daily. 28 tablet 11   Multiple Vitamin (MULTIVITAMIN) tablet Take 1 tablet by mouth daily.     amphetamine-dextroamphetamine (ADDERALL XR) 20 MG 24 hr capsule Take 1 capsule (20 mg total) by mouth daily. With IR as needed in afternoons 30 capsule 0   amphetamine-dextroamphetamine (ADDERALL) 15 MG tablet Take 1 tablet by mouth 2 (two) times daily as needed (ADHD). 60 tablet 0   No facility-administered medications prior  to visit.     Per HPI unless specifically indicated in ROS section below Review of Systems  Objective:  BP 106/64   Pulse 86   Temp 98.4 F (36.9 C) (Oral)   Ht 5\' 9"  (1.753 m)   Wt 142 lb 4 oz (64.5 kg)   LMP 04/01/2023 (Exact Date)   SpO2 99%   BMI 21.01 kg/m   Wt Readings from Last 3 Encounters:  04/12/23 142 lb 4 oz (64.5 kg)  03/23/23 140 lb (63.5 kg)  10/21/22 135 lb 4 oz (61.3 kg)      Physical Exam Vitals and nursing note reviewed.  Constitutional:      Appearance: Normal appearance. She is not ill-appearing.  HENT:     Head: Normocephalic and atraumatic.     Mouth/Throat:     Mouth: Mucous membranes are moist.     Pharynx: Oropharynx is clear. No oropharyngeal exudate or posterior oropharyngeal erythema.  Eyes:     Extraocular Movements: Extraocular movements intact.     Conjunctiva/sclera: Conjunctivae normal.     Pupils: Pupils are equal, round, and reactive to light.  Neck:     Thyroid: No thyroid mass or thyromegaly.  Cardiovascular:     Rate and Rhythm: Normal rate and regular rhythm.     Pulses: Normal pulses.     Heart sounds: Normal heart sounds. No murmur heard. Pulmonary:     Effort: Pulmonary  effort is normal. No respiratory distress.     Breath sounds: Normal breath sounds. No wheezing, rhonchi or rales.  Musculoskeletal:     Cervical back: Normal range of motion and neck supple.     Right lower leg: No edema.     Left lower leg: No edema.  Skin:    General: Skin is warm and dry.     Findings: No rash.  Neurological:     Mental Status: She is alert.  Psychiatric:        Mood and Affect: Mood normal.        Behavior: Behavior normal.        Assessment & Plan:   Problem List Items Addressed This Visit     ADHD (attention deficit hyperactivity disorder), combined type - Primary    Chronic, stable period, doing better on Adderall XR 20mg  daily in am. Takes at 8am, effective until around 6pm. Takes Adderall 15mg  IR in PM PRN. Will drop  IR PRN PM dose to 10mg .  RTC 6 mo CPE/ADHD f/u      Relevant Medications   amphetamine-dextroamphetamine (ADDERALL XR) 20 MG 24 hr capsule     Meds ordered this encounter  Medications   amphetamine-dextroamphetamine (ADDERALL XR) 20 MG 24 hr capsule    Sig: Take 1 capsule (20 mg total) by mouth daily. With IR as needed in afternoons    Dispense:  30 capsule    Refill:  0   amphetamine-dextroamphetamine (ADDERALL) 10 MG tablet    Sig: Take 1 tablet (10 mg total) by mouth daily as needed (afternoon dose as needed).    Dispense:  40 tablet    Refill:  0   amphetamine-dextroamphetamine (ADDERALL XR) 20 MG 24 hr capsule    Sig: Take 1 capsule (20 mg total) by mouth every morning.    Dispense:  30 capsule    Refill:  0   amphetamine-dextroamphetamine (ADDERALL XR) 20 MG 24 hr capsule    Sig: Take 1 capsule (20 mg total) by mouth every morning.    Dispense:  30 capsule    Refill:  0    No orders of the defined types were placed in this encounter.   Patient Instructions  You are doing well today  Continue Adderall XR 20mg  in am with 10mg  IR dose as needed in afternoons.  Good to see you Return as needed or in 6 months for physical (after 10/21/2023).   Follow up plan: No follow-ups on file.  Eustaquio Boyden, MD

## 2023-04-12 NOTE — Assessment & Plan Note (Signed)
Chronic, stable period, doing better on Adderall XR 20mg  daily in am. Takes at 8am, effective until around 6pm. Takes Adderall 15mg  IR in PM PRN. Will drop IR PRN PM dose to 10mg .  RTC 6 mo CPE/ADHD f/u

## 2023-04-27 ENCOUNTER — Ambulatory Visit: Payer: BC Managed Care – PPO | Admitting: Obstetrics & Gynecology

## 2023-07-26 ENCOUNTER — Ambulatory Visit: Payer: Self-pay | Admitting: Family Medicine

## 2023-08-09 ENCOUNTER — Encounter: Payer: Self-pay | Admitting: Family Medicine

## 2023-08-09 ENCOUNTER — Ambulatory Visit: Admitting: Family Medicine

## 2023-08-09 VITALS — BP 112/66 | HR 92 | Temp 98.3°F | Ht 69.0 in | Wt 148.2 lb

## 2023-08-09 DIAGNOSIS — F902 Attention-deficit hyperactivity disorder, combined type: Secondary | ICD-10-CM

## 2023-08-09 DIAGNOSIS — D649 Anemia, unspecified: Secondary | ICD-10-CM | POA: Diagnosis not present

## 2023-08-09 DIAGNOSIS — R110 Nausea: Secondary | ICD-10-CM | POA: Diagnosis not present

## 2023-08-09 DIAGNOSIS — R1013 Epigastric pain: Secondary | ICD-10-CM | POA: Insufficient documentation

## 2023-08-09 LAB — SEDIMENTATION RATE: Sed Rate: 4 mm/h (ref 0–20)

## 2023-08-09 LAB — COMPREHENSIVE METABOLIC PANEL WITH GFR
ALT: 10 U/L (ref 0–35)
AST: 13 U/L (ref 0–37)
Albumin: 4.4 g/dL (ref 3.5–5.2)
Alkaline Phosphatase: 81 U/L (ref 39–117)
BUN: 12 mg/dL (ref 6–23)
CO2: 29 meq/L (ref 19–32)
Calcium: 9.4 mg/dL (ref 8.4–10.5)
Chloride: 103 meq/L (ref 96–112)
Creatinine, Ser: 0.79 mg/dL (ref 0.40–1.20)
GFR: 106.5 mL/min (ref >60.00)
Glucose, Bld: 91 mg/dL (ref 70–99)
Potassium: 4.1 meq/L (ref 3.5–5.1)
Sodium: 139 meq/L (ref 135–145)
Total Bilirubin: 0.6 mg/dL (ref 0.2–1.2)
Total Protein: 7.3 g/dL (ref 6.0–8.3)

## 2023-08-09 LAB — CBC WITH DIFFERENTIAL/PLATELET
Basophils Absolute: 0 10*3/uL (ref 0.0–0.1)
Basophils Relative: 0.5 % (ref 0.0–3.0)
Eosinophils Absolute: 0.4 10*3/uL (ref 0.0–0.7)
Eosinophils Relative: 4.7 % (ref 0.0–5.0)
HCT: 37.5 % (ref 36.0–46.0)
Hemoglobin: 12.8 g/dL (ref 12.0–15.0)
Lymphocytes Relative: 20.4 % (ref 12.0–46.0)
Lymphs Abs: 1.5 10*3/uL (ref 0.7–4.0)
MCHC: 34.3 g/dL (ref 30.0–36.0)
MCV: 88.5 fl (ref 78.0–100.0)
Monocytes Absolute: 0.4 10*3/uL (ref 0.1–1.0)
Monocytes Relative: 5.8 % (ref 3.0–12.0)
Neutro Abs: 5.1 10*3/uL (ref 1.4–7.7)
Neutrophils Relative %: 68.6 % (ref 43.0–77.0)
Platelets: 379 10*3/uL (ref 150.0–400.0)
RBC: 4.24 Mil/uL (ref 3.87–5.11)
RDW: 12.4 % (ref 11.5–15.5)
WBC: 7.4 10*3/uL (ref 4.0–10.5)

## 2023-08-09 LAB — FERRITIN: Ferritin: 31.6 ng/mL (ref 10.0–291.0)

## 2023-08-09 LAB — IBC PANEL
Iron: 124 ug/dL (ref 42–145)
Saturation Ratios: 34.1 % (ref 20.0–50.0)
TIBC: 364 ug/dL (ref 250.0–450.0)
Transferrin: 260 mg/dL (ref 212.0–360.0)

## 2023-08-09 LAB — TSH: TSH: 2.15 u[IU]/mL (ref 0.35–5.50)

## 2023-08-09 LAB — LIPASE: Lipase: 22 U/L (ref 11.0–59.0)

## 2023-08-09 MED ORDER — AMPHETAMINE-DEXTROAMPHET ER 20 MG PO CP24: 20.0000 mg | ORAL_CAPSULE | ORAL | 0 refills | Status: DC

## 2023-08-09 MED ORDER — AMPHETAMINE-DEXTROAMPHET ER 20 MG PO CP24: 20.0000 mg | ORAL_CAPSULE | Freq: Every day | ORAL | 0 refills | Status: DC

## 2023-08-09 NOTE — Assessment & Plan Note (Signed)
 Chronic, stable period on adderall XR 20mg  daily with rare PRN IR 10mg  dose Refill today.  [place] CSRS reviewed.

## 2023-08-09 NOTE — Progress Notes (Signed)
 Ph: [phone] Fax: [phone]   Patient ID: Andrea Bender, female    DOB: 2000/11/30, 23 y.o.   MRN: [id number]  This visit was conducted in person.  BP 112/66   Pulse 92   Temp 98.3 F (36.8 C) (Oral)   Ht 5\' 9"  (1.753 m)   Wt 148 lb 4 oz (67.2 kg)   LMP 07/30/2023   SpO2 98%   BMI 21.89 kg/m    CC: GI upset  Subjective:   HPI: Andrea Bender is a 23 y.o. female presenting on 08/09/2023 for GI Problem (C/o nausea, gas, constipation after eating certain foods. Also, c/o occasional blood stool and a growth in anal area. Started years ago. Sxs happening frequently. )   Ongoing GI issue for years - feels nauseated after eating random foods with epigastric abd pain, no vomiting. Not fully finishing meals due to nausea but still feels hungry. Urge to have BM but then unable to defecate. This seems to happen more on Fridays. Stool urgency. Occ constipation alternating with loose but formed stools.   04/2023 - while on period, noted "pinkish" blood with wiping - this has persisted even after period. She notes small non-tender lump to perianal region worse after straining, currently not an issue. Tender with BMs, not otherwise.   Has tried pepto bismo and ibguard with significant benefit.   "Safe foods" include sushi from [place]. Always nauseated to red meats.  No recent tick bites.  Trial gluten -free diet without significant benefit.  Thinks she may have red-40 intolerance (symptoms when she eats swedish fish).  She notes lactose intolerance - trouble tolerating milk, ice cream.  No rash with foods.  No fmhx celiac disease.  Alcohol - none No NSAIDs recently.  Spicy foods - none Caffeine - not daily   No fevers/chills, abdominal cramping, watery diarrhea, no significant GERD symptoms. No UTI symptoms. No biliary colic. No unexpected weight loss.   Chronic dysmenorrhea - on OCP for this.   Lives with GF. Requests Adderall refilled - this is effective and tolerated well.  Doing well at school - 90s on recent exams.     Relevant past medical, surgical, family and social history reviewed and updated as indicated. Interim medical history since our last visit reviewed. Allergies and medications reviewed and updated. Outpatient Medications Prior to Visit  Medication Sig Dispense Refill   amphetamine-dextroamphetamine (ADDERALL) 10 MG tablet Take 1 tablet (10 mg total) by mouth daily as needed (afternoon dose as needed). 40 tablet 0   Clindamycin-Benzoyl Per, Refr, gel APPLY TO AFFECTED AREA EVERY DAY     drospirenone-ethinyl estradiol (YASMIN 28) 3-0.03 MG tablet Take 1 tablet by mouth daily. 28 tablet 11   Multiple Vitamin (MULTIVITAMIN) tablet Take 1 tablet by mouth daily.     amphetamine-dextroamphetamine (ADDERALL XR) 20 MG 24 hr capsule Take 1 capsule (20 mg total) by mouth daily. With IR as needed in afternoons 30 capsule 0   amphetamine-dextroamphetamine (ADDERALL XR) 20 MG 24 hr capsule Take 1 capsule (20 mg total) by mouth every morning. 30 capsule 0   amphetamine-dextroamphetamine (ADDERALL XR) 20 MG 24 hr capsule Take 1 capsule (20 mg total) by mouth every morning. 30 capsule 0   diclofenac (VOLTAREN) 75 MG EC tablet TAKE 1 TABLET BY MOUTH TWICE A DAY 60 tablet 2   No facility-administered medications prior to visit.     Per HPI unless specifically indicated in ROS section below Review of Systems  Objective:  BP 112/66  Pulse 92   Temp 98.3 F (36.8 C) (Oral)   Ht 5\' 9"  (1.753 m)   Wt 148 lb 4 oz (67.2 kg)   LMP 07/30/2023   SpO2 98%   BMI 21.89 kg/m   Wt Readings from Last 3 Encounters:  08/09/23 148 lb 4 oz (67.2 kg)  04/12/23 142 lb 4 oz (64.5 kg)  03/23/23 140 lb (63.5 kg)      Physical Exam Vitals and nursing note reviewed.  Constitutional:      Appearance: Normal appearance. She is not ill-appearing.  HENT:     Head: Normocephalic and atraumatic.     Mouth/Throat:     Mouth: Mucous membranes are moist.     Pharynx:  Oropharynx is clear. No oropharyngeal exudate or posterior oropharyngeal erythema.  Eyes:     Extraocular Movements: Extraocular movements intact.     Pupils: Pupils are equal, round, and reactive to light.  Cardiovascular:     Rate and Rhythm: Regular rhythm. Tachycardia present.     Pulses: Normal pulses.     Heart sounds: Normal heart sounds. No murmur heard. Pulmonary:     Effort: Pulmonary effort is normal. No respiratory distress.     Breath sounds: Normal breath sounds. No wheezing, rhonchi or rales.  Abdominal:     General: Abdomen is flat. Bowel sounds are increased. There is no distension.     Palpations: Abdomen is soft. There is no mass.     Tenderness: There is abdominal tenderness (mild-mod) in the epigastric area. There is no guarding or rebound. Negative signs include Murphy's sign.     Hernia: No hernia is present.  Genitourinary:    Rectum: No mass, anal fissure or external hemorrhoid.     Comments: External perianal exam without bleeding, hemorrhoid, mass or other lesions Musculoskeletal:     Right lower leg: No edema.     Left lower leg: No edema.  Skin:    General: Skin is warm and dry.     Findings: No rash.  Neurological:     Mental Status: She is alert.  Psychiatric:        Mood and Affect: Mood normal.        Behavior: Behavior normal.        Assessment & Plan:   Problem List Items Addressed This Visit     ADHD (attention deficit hyperactivity disorder), combined type   Chronic, stable period on adderall XR 20mg  daily with rare PRN IR 10mg  dose Refill today.  [place] CSRS reviewed.       Relevant Medications   amphetamine-dextroamphetamine (ADDERALL XR) 20 MG 24 hr capsule   Anemia   H/o this - update CBC along with iron panel and testing for celiac disease      Relevant Orders   CBC with Differential/Platelet   IBC panel   Ferritin   Epigastric abdominal pain - Primary   Describes years of GI upset associated with nausea and epigastric pain  without clear triggering foods, as well as stool urgency and tenesmus.  No obvious external hemorrhoid on exam. Suspect blood on TP due to intermittent flares of internal hemorrhoid - discussed.  She did take course of oral voltaren back last year, no recent NSAID, alcohol use.  Will further evaluate with labs including lipase, ESR, CBC/CMP and H pylori stool Ag test.  After submitting stool sample, start omeprazole 40mg  daily 3 wk course to treat possible gastritis, discussed PRN IBguard and probiotic use Update if above doesn't resolve  symptoms.       Relevant Orders   Comprehensive metabolic panel with GFR   CBC with Differential/Platelet   TSH   IGA   Tissue Transglutaminase, IGA   Sedimentation Rate   Lipase   H. pylori antigen, stool   Nausea   Intermittent, no consistent trigger other than red meat.  No other symptoms of red meat allergy - doubt alpha gal.  See above.       Relevant Orders   Comprehensive metabolic panel with GFR   CBC with Differential/Platelet   H. pylori antigen, stool     Meds ordered this encounter  Medications   amphetamine-dextroamphetamine (ADDERALL XR) 20 MG 24 hr capsule    Sig: Take 1 capsule (20 mg total) by mouth daily. With IR as needed in afternoons    Dispense:  30 capsule    Refill:  0   amphetamine-dextroamphetamine (ADDERALL XR) 20 MG 24 hr capsule    Sig: Take 1 capsule (20 mg total) by mouth every morning.    Dispense:  30 capsule    Refill:  0   amphetamine-dextroamphetamine (ADDERALL XR) 20 MG 24 hr capsule    Sig: Take 1 capsule (20 mg total) by mouth every morning.    Dispense:  30 capsule    Refill:  0    Orders Placed This Encounter  Procedures   Comprehensive metabolic panel with GFR   CBC with Differential/Platelet   TSH   IGA   Tissue Transglutaminase, IGA   Sedimentation Rate   Lipase   H. pylori antigen, stool    Standing Status:   Future    Expiration Date:   08/08/2024   IBC panel   Ferritin     Patient Instructions  Labs, stool test today  After submitting stool test, start omeprazole 40mg  daily 30 min before meal for 2-3 weeks then as needed.  May continue IB guard as needed. Consider trial of probiotic like activia vs Phillips colon health probiotic.  Try to limit caffeine, spicy foods, dark sodas.   Let us know if ongoing symptoms after omeprazole treatment.  Good to see you today.   Follow up plan: Return if symptoms worsen or fail to improve.  Eustaquio Boyden, MD

## 2023-08-09 NOTE — Patient Instructions (Addendum)
 Labs, stool test today  After submitting stool test, start omeprazole 40mg  daily 30 min before meal for 2-3 weeks then as needed.  May continue IB guard as needed. Consider trial of probiotic like activia vs Phillips colon health probiotic.  Try to limit caffeine, spicy foods, dark sodas.   Let us know if ongoing symptoms after omeprazole treatment.  Good to see you today.

## 2023-08-09 NOTE — Assessment & Plan Note (Addendum)
 Describes years of GI upset associated with nausea and epigastric pain without clear triggering foods, as well as stool urgency and tenesmus.  No obvious external hemorrhoid on exam. Suspect blood on TP due to intermittent flares of internal hemorrhoid - discussed.  She did take course of oral voltaren back last year, no recent NSAID, alcohol use.  Will further evaluate with labs including lipase, ESR, CBC/CMP and H pylori stool Ag test.  After submitting stool sample, start omeprazole 40mg  daily 3 wk course to treat possible gastritis, discussed PRN IBguard and probiotic use Update if above doesn't resolve symptoms.

## 2023-08-09 NOTE — Assessment & Plan Note (Signed)
 H/o this - update CBC along with iron panel and testing for celiac disease

## 2023-08-09 NOTE — Assessment & Plan Note (Signed)
 Intermittent, no consistent trigger other than red meat.  No other symptoms of red meat allergy - doubt alpha gal.  See above.

## 2023-08-10 ENCOUNTER — Encounter: Payer: Self-pay | Admitting: Family Medicine

## 2023-08-10 LAB — IGA: Immunoglobulin A: 102 mg/dL (ref 47–310)

## 2023-08-10 LAB — TISSUE TRANSGLUTAMINASE, IGA: (tTG) Ab, IgA: 2.3 U/mL

## 2023-09-04 ENCOUNTER — Other Ambulatory Visit: Payer: Self-pay

## 2023-09-04 DIAGNOSIS — R110 Nausea: Secondary | ICD-10-CM

## 2023-09-04 DIAGNOSIS — R1013 Epigastric pain: Secondary | ICD-10-CM

## 2023-09-06 LAB — H. PYLORI ANTIGEN, STOOL: H pylori Ag, Stl: NEGATIVE

## 2023-09-06 LAB — SPECIMEN STATUS REPORT

## 2023-09-07 ENCOUNTER — Encounter: Payer: Self-pay | Admitting: Family Medicine

## 2023-12-25 ENCOUNTER — Telehealth: Payer: Self-pay

## 2023-12-25 NOTE — Telephone Encounter (Signed)
 Called pt. Advised how to get information on mychart. She will attempt to get information there. If she has any problems she will call us  back.

## 2023-12-25 NOTE — Telephone Encounter (Signed)
 Copied from CRM #[id number]. 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 Pt stated they need the records by today or they will un enroll her.  Fax number is [phone],  [employer]  in [place]. Pt would like a callback with an update.

## 2024-01-01 ENCOUNTER — Encounter: Payer: Self-pay | Admitting: Family Medicine

## 2024-01-01 ENCOUNTER — Ambulatory Visit: Payer: Self-pay | Admitting: Family Medicine

## 2024-01-01 ENCOUNTER — Ambulatory Visit: Admitting: Family Medicine

## 2024-01-01 VITALS — BP 90/62 | HR 103 | Temp 98.4°F | Ht 69.0 in | Wt 144.5 lb

## 2024-01-01 DIAGNOSIS — F902 Attention-deficit hyperactivity disorder, combined type: Secondary | ICD-10-CM

## 2024-01-01 DIAGNOSIS — R197 Diarrhea, unspecified: Secondary | ICD-10-CM | POA: Diagnosis not present

## 2024-01-01 DIAGNOSIS — R102 Pelvic and perineal pain: Secondary | ICD-10-CM | POA: Diagnosis not present

## 2024-01-01 DIAGNOSIS — A0472 Enterocolitis due to Clostridium difficile, not specified as recurrent: Secondary | ICD-10-CM | POA: Insufficient documentation

## 2024-01-01 LAB — POC URINALSYSI DIPSTICK (AUTOMATED)
Bilirubin, UA: NEGATIVE
Blood, UA: NEGATIVE
Glucose, UA: NEGATIVE
Ketones, UA: NEGATIVE
Leukocytes, UA: NEGATIVE
Nitrite, UA: NEGATIVE
Protein, UA: POSITIVE — AB
Spec Grav, UA: 1.015 (ref 1.010–1.025)
Urobilinogen, UA: 0.2 U/dL
pH, UA: 6.5 (ref 5.0–8.0)

## 2024-01-01 MED ORDER — AMPHETAMINE-DEXTROAMPHET ER 20 MG PO CP24: 20.0000 mg | ORAL_CAPSULE | ORAL | 0 refills | Status: AC

## 2024-01-01 MED ORDER — AMPHETAMINE-DEXTROAMPHET ER 20 MG PO CP24: 20.0000 mg | ORAL_CAPSULE | Freq: Every day | ORAL | 0 refills | Status: AC

## 2024-01-01 NOTE — Progress Notes (Signed)
 Ph: [phone] Fax: [phone]   Patient ID: Andrea Bender, female    DOB: 08/03/2000, 23 y.o.   MRN: [id number]  This visit was conducted in person.  BP 90/62   Pulse (!) 103   Temp 98.4 F (36.9 C) (Oral)   Ht 5' 9 (1.753 m)   Wt 144 lb 8 oz (65.5 kg)   LMP 12/10/2023   SpO2 95%   BMI 21.34 kg/m   BP Readings from Last 3 Encounters:  01/01/24 90/62  08/09/23 112/66  04/12/23 106/64    Chief Complaint  Patient presents with   Diarrhea    Pt has watery Diarrhea multiple times a day. Incontinence     Subjective:   Discussed the use of AI scribe software for clinical note transcription with the patient, who gave verbal consent to proceed.  History of Present Illness   Andrea Bender is a 23 year old female who presents with diarrhea and bowel movement irregularities.  She previously experienced gastrointestinal issues, including nausea after eating, epigastric abdominal pain, stool urgency, and alternating constipation with loose stools. Previous workup in April 2025 showed normal results for alpha-gal, H. pylori, celiac disease, inflammatory markers, CBC, CMP, lipase, and thyroid levels. Omeprazole improved her nausea, and she uses IBgard as needed.   Two weeks ago, she developed new symptoms of pelvic and rectal pressure, with more regular watery stools 3-6 times a day, feeling the need to pass gas without relief. This pressure causes anxiety about finding a bathroom. 1 week ago she experienced an episode of fecal incontinence while walking to class. She has a persistent feeling of needing to defecate without being able to pass stool and sometimes notices a pinkish discharge when wiping.    A few days later developed URI symptoms of sore throat from post nasal drainage, congestion, without fever or cough.   She denies current nausea, vomiting, vaginal symptoms, or UTI symptoms. She reports no recent travel-related illnesses, and no one else at home has similar  symptoms. She has not used antibiotics recently but has used Pepto-Bismol and IBgard more consistently. She notes mild weight loss due to decreased appetite. 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          Relevant past medical, surgical, family and social history reviewed and updated as indicated. Interim medical history since our last visit reviewed. Allergies and medications reviewed and updated. Outpatient Medications Prior to Visit  Medication Sig Dispense Refill   amphetamine -dextroamphetamine  (ADDERALL) 10 MG tablet Take 1 tablet (10 mg total) by mouth daily as needed (afternoon dose as needed). 40 tablet 0   Clindamycin-Benzoyl Per, Refr, gel APPLY TO AFFECTED AREA EVERY DAY     drospirenone -ethinyl estradiol  (YASMIN  28) 3-0.03 MG tablet Take 1 tablet by mouth daily. 28 tablet 11   Multiple Vitamin (MULTIVITAMIN) tablet Take 1 tablet by mouth daily.     amphetamine -dextroamphetamine  (ADDERALL XR) 20 MG 24 hr capsule Take 1 capsule (20 mg total) by mouth daily. With IR as needed in afternoons 30 capsule 0   amphetamine -dextroamphetamine  (ADDERALL XR) 20 MG 24 hr capsule Take 1 capsule (20 mg total) by mouth every morning. 30 capsule 0   amphetamine -dextroamphetamine  (ADDERALL XR) 20 MG 24 hr capsule Take 1 capsule (20 mg total) by mouth every morning. 30 capsule 0   No facility-administered medications prior to visit.     Per HPI unless specifically indicated  in ROS section below Review of Systems  Objective:  BP 90/62   Pulse (!) 103   Temp 98.4 F (36.9 C) (Oral)   Ht 5' 9 (1.753 m)   Wt 144 lb 8 oz (65.5 kg)   LMP 12/10/2023   SpO2 95%   BMI 21.34 kg/m   Wt Readings from Last 3 Encounters:  01/01/24 144 lb 8 oz (65.5 kg)  08/09/23 148 lb 4 oz (67.2 kg)  04/12/23 142 lb 4 oz (64.5 kg)     Physical Exam Vitals and nursing note reviewed.  Constitutional:      Appearance:  Normal appearance. She is not ill-appearing.  HENT:     Head: Normocephalic and atraumatic.     Mouth/Throat:     Mouth: Mucous membranes are moist.     Pharynx: Oropharynx is clear. No oropharyngeal exudate or posterior oropharyngeal erythema.  Eyes:     Extraocular Movements: Extraocular movements intact.     Conjunctiva/sclera: Conjunctivae normal.     Pupils: Pupils are equal, round, and reactive to light.  Neck:     Thyroid: No thyroid mass or thyromegaly.  Cardiovascular:     Rate and Rhythm: Normal rate and regular rhythm.     Pulses: Normal pulses.     Heart sounds: Normal heart sounds. No murmur heard. Pulmonary:     Effort: Pulmonary effort is normal. No respiratory distress.     Breath sounds: Normal breath sounds. No wheezing, rhonchi or rales.  Abdominal:     General: Bowel sounds are normal. There is no distension.     Palpations: Abdomen is soft. There is no mass.     Tenderness: There is abdominal tenderness (mild) in the suprapubic area. There is no guarding or rebound. Negative signs include Murphy's sign.     Hernia: No hernia is present.  Musculoskeletal:        General: Normal range of motion.     Cervical back: Normal range of motion and neck supple.     Right lower leg: No edema.     Left lower leg: No edema.  Skin:    General: Skin is warm and dry.     Findings: No rash.  Neurological:     Mental Status: She is alert.  Psychiatric:        Mood and Affect: Mood normal.        Behavior: Behavior normal.       Results   LABS Alpha-gal: Normal (08/2023) H. pylori stool antigen: Negative (08/2023) Tissue transglutaminase IgA: Negative (08/2023) Inflammatory markers: Normal (08/2023) Complete Blood Count (CBC): Normal (08/2023) Comprehensive Metabolic Panel (CMP): Normal (95/7974) Lipase: Normal (08/2023) Thyroid function tests: Normal (08/2023)       Results for orders placed or performed in visit on 09/04/23  H. pylori antigen, stool    Collection Time: 09/04/23 12:00 AM  Result Value Ref Range   H pylori Ag, Stl Negative Negative  Specimen status report   Collection Time: 09/04/23 12:00 AM  Result Value Ref Range   specimen status report Comment     Assessment & Plan:      Chronic diarrhea with recent worsening, fecal incontinence, and rectal/pelvic pressure Chronic diarrhea with recent worsening, fecal incontinence, and rectal/pelvic pressure. Previous workup negative for alpha-gal, H. pylori, celiac disease, and showed normal inflammatory markers, CBC, CMP, lipase, and thyroid function. Symptoms inconsistent and not clearly related to diet or medications. - Order stool tests for infection and inflammation. - Advise use of Imodium  as needed for diarrhea, especially before class. - Encourage hydration with fluids like Pedialyte, ginger ale, or liquid IV. - Consider referral to gastroenterology if symptoms persist and stool tests are normal.  Acute upper respiratory infection Acute upper respiratory infection with symptoms starting approximately one week ago, including congestion, sore throat, and post-nasal drip.  Attention-deficit hyperactivity disorder (ADHD) ADHD managed with Adderall XR 20 mg daily and occasional use of Adderall IR 10 mg as needed. - Refill Adderall XR prescription.      Problem List Items Addressed This Visit     ADHD (attention deficit hyperactivity disorder), combined type   Chronic, stable period on adderall XR 20mg  daily with PRN IR 10mg .  Refill today.       Relevant Medications   amphetamine -dextroamphetamine  (ADDERALL XR) 20 MG 24 hr capsule   Watery diarrhea - Primary   Ongoing for 2 wks. Suspect infectious cause.  Given duration, check GI pathogen panel as well as fecal calprotectin. Discussed imodium PRN, further supportive measures including good hydration status Reviewed workup from 08/2023 - reassuringly normal       Relevant Orders   C. difficile GDH and Toxin A/B    Calprotectin, Fecal   Gastrointestinal Pathogen Pnl RT, PCR     Meds ordered this encounter  Medications   amphetamine -dextroamphetamine  (ADDERALL XR) 20 MG 24 hr capsule    Sig: Take 1 capsule (20 mg total) by mouth daily. With IR as needed in afternoons    Dispense:  30 capsule    Refill:  0   amphetamine -dextroamphetamine  (ADDERALL XR) 20 MG 24 hr capsule    Sig: Take 1 capsule (20 mg total) by mouth every morning.    Dispense:  30 capsule    Refill:  0   amphetamine -dextroamphetamine  (ADDERALL XR) 20 MG 24 hr capsule    Sig: Take 1 capsule (20 mg total) by mouth every morning.    Dispense:  30 capsule    Refill:  0    Orders Placed This Encounter  Procedures   Calprotectin, Fecal   C. difficile GDH and Toxin A/B   Gastrointestinal Pathogen Pnl RT, PCR    Patient Instructions  Pass by lab for stool testing  May use imodium as needed for diarrhea.  Let us  know if not improving with time, for referral to gastroenterology.   VISIT SUMMARY: During your visit, we discussed your ongoing gastrointestinal issues, including diarrhea, bowel movement irregularities, and recent symptoms of pelvic and rectal pressure. We also addressed an acute upper respiratory infection, and your ADHD management.  YOUR PLAN: -CHRONIC DIARRHEA WITH RECENT WORSENING, FECAL INCONTINENCE, AND RECTAL/PELVIC PRESSURE: Chronic diarrhea means having frequent loose stools for an extended period. We will order stool tests to check for infection and inflammation. You can use Imodium as needed for diarrhea, especially before class, and stay hydrated with fluids like Pedialyte, ginger ale, gatorade, or liquid IV powder. If symptoms persist and stool tests are normal, we may refer you to a gastroenterologist.  -ACUTE UPPER RESPIRATORY INFECTION: An acute upper respiratory infection is a sudden infection that affects the nose, throat, and airways. Your symptoms started about a week ago and include congestion, sore  throat, and post-nasal drip. We will monitor your symptoms and provide supportive care as needed.  -ATTENTION-DEFICIT HYPERACTIVITY DISORDER (ADHD): ADHD is a condition that affects focus and behavior. You are currently managing it with Adderall XR 20 mg daily and occasional use of Adderall IR 10 mg as needed. We have refilled your Adderall  XR prescription.  INSTRUCTIONS: We will follow up with the results of your stool tests. If your gastrointestinal symptoms persist despite normal test results, we will consider referring you to a gastroenterologist. Continue using Imodium as needed for diarrhea and stay hydrated. Monitor your weight and appetite, and let us  know if you experience any significant changes. For your upper respiratory infection, use supportive care measures like rest, fluids, and over-the-counter medications as needed. Continue your current ADHD medication regimen, and we have refilled your Adderall XR prescription.  Follow up plan: Return if symptoms worsen or fail to improve.  Anton Blas, MD

## 2024-01-01 NOTE — Addendum Note (Signed)
 Addended by: HOPE VEVA PARAS on: 01/01/2024 11:04 AM   Modules accepted: Orders

## 2024-01-01 NOTE — Assessment & Plan Note (Signed)
 Chronic, stable period on adderall XR 20mg  daily with PRN IR 10mg .  Refill today.

## 2024-01-01 NOTE — Addendum Note (Signed)
 Addended by: DELORES, Glennie Bose L on: 01/01/2024 10:50 AM   Modules accepted: Orders

## 2024-01-01 NOTE — Assessment & Plan Note (Addendum)
 Ongoing for 2 wks. Suspect infectious cause.  Given duration, check GI pathogen panel as well as fecal calprotectin. Discussed imodium PRN, further supportive measures including good hydration status Reviewed workup from 08/2023 - reassuringly normal

## 2024-01-01 NOTE — Patient Instructions (Addendum)
 Pass by lab for stool testing  May use imodium as needed for diarrhea.  Let us  know if not improving with time, for referral to gastroenterology.   VISIT SUMMARY: During your visit, we discussed your ongoing gastrointestinal issues, including diarrhea, bowel movement irregularities, and recent symptoms of pelvic and rectal pressure. We also addressed an acute upper respiratory infection, and your ADHD management.  YOUR PLAN: -CHRONIC DIARRHEA WITH RECENT WORSENING, FECAL INCONTINENCE, AND RECTAL/PELVIC PRESSURE: Chronic diarrhea means having frequent loose stools for an extended period. We will order stool tests to check for infection and inflammation. You can use Imodium as needed for diarrhea, especially before class, and stay hydrated with fluids like Pedialyte, ginger ale, gatorade, or liquid IV powder. If symptoms persist and stool tests are normal, we may refer you to a gastroenterologist.  -ACUTE UPPER RESPIRATORY INFECTION: An acute upper respiratory infection is a sudden infection that affects the nose, throat, and airways. Your symptoms started about a week ago and include congestion, sore throat, and post-nasal drip. We will monitor your symptoms and provide supportive care as needed.  -ATTENTION-DEFICIT HYPERACTIVITY DISORDER (ADHD): ADHD is a condition that affects focus and behavior. You are currently managing it with Adderall XR 20 mg daily and occasional use of Adderall IR 10 mg as needed. We have refilled your Adderall XR prescription.  INSTRUCTIONS: We will follow up with the results of your stool tests. If your gastrointestinal symptoms persist despite normal test results, we will consider referring you to a gastroenterologist. Continue using Imodium as needed for diarrhea and stay hydrated. Monitor your weight and appetite, and let us  know if you experience any significant changes. For your upper respiratory infection, use supportive care measures like rest, fluids, and  over-the-counter medications as needed. Continue your current ADHD medication regimen, and we have refilled your Adderall XR prescription.

## 2024-01-09 ENCOUNTER — Other Ambulatory Visit: Payer: Self-pay | Admitting: Radiology

## 2024-01-09 DIAGNOSIS — R197 Diarrhea, unspecified: Secondary | ICD-10-CM

## 2024-01-10 ENCOUNTER — Ambulatory Visit: Payer: Self-pay | Admitting: *Deleted

## 2024-01-10 ENCOUNTER — Other Ambulatory Visit: Payer: Self-pay | Admitting: Family Medicine

## 2024-01-10 LAB — C. DIFFICILE GDH AND TOXIN A/B
GDH ANTIGEN: DETECTED
MICRO NUMBER:: 16910218
SPECIMEN QUALITY:: ADEQUATE
TOXIN A AND B: DETECTED

## 2024-01-10 MED ORDER — VANCOMYCIN HCL 125 MG PO CAPS
125.0000 mg | ORAL_CAPSULE | Freq: Four times a day (QID) | ORAL | 0 refills | Status: AC
Start: 2024-01-10 — End: 2024-01-20

## 2024-01-10 NOTE — Telephone Encounter (Signed)
 Spoke with patient. Over the past 2 days having lower abd pain and pressure associated with watery diarrhea multiple times a day, has had to stay home from school and work. No fevers/chills, bloody diarrhea. Feels she's staying well hydrated. C diff testing returned positive - ERx oral vancomycin . Update if not improving with treatment.

## 2024-01-10 NOTE — Progress Notes (Signed)
 ERx vancomcyin 10d course.

## 2024-01-10 NOTE — Telephone Encounter (Signed)
 FYI Only or Action Required?: Action required by provider: clinical question for provider, update on patient condition, and lab or test result follow-up needed.  Patient was last seen in primary care on 01/01/2024 by Rilla Baller, MD.  Called Nurse Triage reporting Diarrhea.  Symptoms began yesterday.  Interventions attempted: Rest, hydration, or home remedies.  Symptoms are: gradually worsening.  Triage Disposition: See Physician Within 24 Hours  Patient/caregiver understands and will follow disposition?: No, wishes to speak with PCP  Patient requesting call back with lab results.           Copied from CRM #[id number]. Topic: Clinical - Red Word Triage >> Jan 10, 2024 12:14 PM [place] J wrote: [facility] that prompted transfer to Nurse Triage: For the past 2-3 days the patient has been in a lot of pain and has had uncontrollable diarrhea. Reason for Disposition  [1] SEVERE diarrhea (e.g., 7 or more times / day more than normal) AND [2] present > 24 hours (1 day)  Answer Assessment - Initial Assessment Questions Patient requesting results from stool sample yesterday. No result noted from PCP at this time to review with patient. Patient reports low abdominal pain worsening at times. More than 7 times of loose BM's in 24 hours. Patient reports she can not go to class or work. Please advise. Recommended go to ED if sx worsen. Denies dizziness , N/V or fever at this time. Please advise , patient wants call back.     1. DIARRHEA SEVERITY: How bad is the diarrhea? How many more stools have you had in the past 24 hours than normal?      Greater than 7 times  2. ONSET: When did the diarrhea begin?      On going since 01/01/24 3. STOOL DESCRIPTION:  How loose or watery is the diarrhea? What is the stool color? Is there any blood or mucous in the stool?     Yellowish  4. VOMITING: Are you also vomiting? If Yes, ask: How many times in the past 24 hours?      Na  5.  ABDOMEN PAIN: Are you having any abdomen pain? If Yes, ask: What does it feel like? (e.g., crampy, dull, intermittent, constant)      Like period cramps. 6. ABDOMEN PAIN SEVERITY: If present, ask: How bad is the pain?  (e.g., Scale 1-10; mild, moderate, or severe)     moderate 7. ORAL INTAKE: If vomiting, Have you been able to drink liquids? How much liquids have you had in the past 24 hours?     na 8. HYDRATION: Any signs of dehydration? (e.g., dry mouth [not just dry lips], too weak to stand, dizziness, new weight loss) When did you last urinate?     no 9. EXPOSURE: Have you traveled to a foreign country recently? Have you been exposed to anyone with diarrhea? Could you have eaten any food that was spoiled?     Na  10. ANTIBIOTIC USE: Are you taking antibiotics now or have you taken antibiotics in the past 2 months?       Yes  11. OTHER SYMPTOMS: Do you have any other symptoms? (e.g., fever, blood in stool)       Loose BM yellow in color. Low abdominal pain moderate cramping like period cramps comes and goes. Feeling exhausted. Able to tolerate fluids. 12. PREGNANCY: Is there any chance you are pregnant? When was your last menstrual period?       na  Protocols used: [facility]

## 2024-01-11 LAB — CALPROTECTIN, FECAL: Calprotectin, Fecal: 3090 ug/g — ABNORMAL HIGH (ref 0–120)

## 2024-01-11 LAB — GASTROINTESTINAL PATHOGEN PNL
CampyloBacter Group: NOT DETECTED
Norovirus GI/GII: NOT DETECTED
Rotavirus A: NOT DETECTED
Salmonella species: NOT DETECTED
Shiga Toxin 1: NOT DETECTED
Shiga Toxin 2: NOT DETECTED
Shigella Species: NOT DETECTED
Vibrio Group: NOT DETECTED
Yersinia enterocolitica: NOT DETECTED

## 2024-01-22 NOTE — Telephone Encounter (Signed)
 Plz check  on pt diarrhea symptoms after treatment for C diff with oral vanc course. Also let her know letter requested should be available through mychart.

## 2024-01-24 NOTE — Telephone Encounter (Signed)
 Called patient symptoms have improved. She was able to print letter. No further action needed.

## 2024-02-12 ENCOUNTER — Ambulatory Visit: Payer: Self-pay

## 2024-02-12 DIAGNOSIS — A0472 Enterocolitis due to Clostridium difficile, not specified as recurrent: Secondary | ICD-10-CM

## 2024-02-12 NOTE — Telephone Encounter (Signed)
 Next day OV scheduled with another provider in PCP office. Would like to move forward with GI referral as well.  FYI Only or Action Required?: Action required by provider: referral request.  Patient was last seen in primary care on 01/01/2024 by Rilla Baller, MD.  Called Nurse Triage reporting GI Problem.  Symptoms began a week ago.  Interventions attempted: OTC medications: imodium.  Symptoms are: gradually worsening.  Triage Disposition: See Physician Within 24 Hours  Patient/caregiver understands and will follow disposition?: Yes  Reason for Disposition  [1] MODERATE pain (e.g., interferes with normal activities) AND [2] pain comes and goes (cramps) AND [3] present > 24 hours  (Exception: Pain with Vomiting or Diarrhea - see that Guideline.)  [1] MODERATE diarrhea (e.g., 4-6 times / day more than normal) AND [2] present > 48 hours (2 days)  Answer Assessment - Initial Assessment Questions Pt reports that symptoms from C. Diff resolved after vancomycin , but diarrhea has returned and now has upper abdominal bloating described as bubbling/gas feeling. States symptoms returned around 03/07/24 after her menstrual period. Experiences nausea when on the toilet and experiencing pain, denies vomiting or fever.   Denies blood in her stool. Describes as watery and green or light brown. Patient has taken imodium PRN.   1. LOCATION: Where does it hurt?      Upper abdomen  2. RADIATION: Does the pain shoot anywhere else? (e.g., chest, back)     Denies  3. ONSET: When did the pain begin? (e.g., minutes, hours or days ago)      Chronic, new onset 03/07/24  4. SUDDEN: Gradual or sudden onset?     Diarrhea comes on suddenly  5. PATTERN Does the pain come and go, or is it constant?     Intermittent  6. SEVERITY: How bad is the pain?  (e.g., Scale 1-10; mild, moderate, or severe)     3-8/10  7. RECURRENT SYMPTOM:     Yes  8: PREGNANCY:     Denies  Answer Assessment  - Initial Assessment Questions 1. DIARRHEA SEVERITY: How bad is the diarrhea? How many more stools have you had in the past 24 hours than normal?      Varies, comes on suddenly  2. ONSET: When did the diarrhea begin?      Chronic since having C. Diff. Recurrence since 02/06/24  3. STOOL DESCRIPTION:  How loose or watery is the diarrhea? What is the stool color? Is there any blood or mucous in the stool?     Watery, mostly brown/orange or green  4. VOMITING: Are you also vomiting? If Yes, ask: How many times in the past 24 hours?      Denies  Protocols used: Diarrhea-A-AH, Abdominal Pain - Female-A-AH

## 2024-02-12 NOTE — Addendum Note (Signed)
 Addended by: RILLA BALLER on: 02/12/2024 05:32 PM   Modules accepted: Orders

## 2024-02-12 NOTE — Telephone Encounter (Signed)
 NOTED

## 2024-02-12 NOTE — Telephone Encounter (Addendum)
 Noted. Appreciate Tabitha seeing this nice patient.  Last treated for C diff diarrhea on 01/10/2024 with oral vancomycin  125mg  QID x10d.   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   In my experience fidaxomicin (Dificid) has been unaffordable when I've previously prescribed it.

## 2024-02-13 NOTE — Telephone Encounter (Signed)
Thank you so much for the heads up!

## 2024-02-14 ENCOUNTER — Ambulatory Visit: Payer: Self-pay | Admitting: Family

## 2024-02-14 ENCOUNTER — Ambulatory Visit (INDEPENDENT_AMBULATORY_CARE_PROVIDER_SITE_OTHER)

## 2024-02-14 ENCOUNTER — Ambulatory Visit: Admitting: Family

## 2024-02-14 ENCOUNTER — Encounter: Payer: Self-pay | Admitting: Family

## 2024-02-14 VITALS — BP 102/64 | HR 96 | Temp 99.0°F | Ht 69.0 in | Wt 141.4 lb

## 2024-02-14 DIAGNOSIS — A498 Other bacterial infections of unspecified site: Secondary | ICD-10-CM

## 2024-02-14 DIAGNOSIS — R1085 Abdominal pain of multiple sites: Secondary | ICD-10-CM

## 2024-02-14 DIAGNOSIS — Z8619 Personal history of other infectious and parasitic diseases: Secondary | ICD-10-CM

## 2024-02-14 DIAGNOSIS — R195 Other fecal abnormalities: Secondary | ICD-10-CM

## 2024-02-14 DIAGNOSIS — D500 Iron deficiency anemia secondary to blood loss (chronic): Secondary | ICD-10-CM | POA: Diagnosis not present

## 2024-02-14 DIAGNOSIS — R197 Diarrhea, unspecified: Secondary | ICD-10-CM | POA: Diagnosis not present

## 2024-02-14 LAB — CBC WITH DIFFERENTIAL/PLATELET
Basophils Absolute: 0 K/uL (ref 0.0–0.1)
Basophils Relative: 0.3 % (ref 0.0–3.0)
Eosinophils Absolute: 0.1 K/uL (ref 0.0–0.7)
Eosinophils Relative: 1.7 % (ref 0.0–5.0)
HCT: 35.7 % — ABNORMAL LOW (ref 36.0–46.0)
Hemoglobin: 11.8 g/dL — ABNORMAL LOW (ref 12.0–15.0)
Lymphocytes Relative: 17.6 % (ref 12.0–46.0)
Lymphs Abs: 1.5 K/uL (ref 0.7–4.0)
MCHC: 33.1 g/dL (ref 30.0–36.0)
MCV: 86.1 fl (ref 78.0–100.0)
Monocytes Absolute: 0.7 K/uL (ref 0.1–1.0)
Monocytes Relative: 8 % (ref 3.0–12.0)
Neutro Abs: 6.1 K/uL (ref 1.4–7.7)
Neutrophils Relative %: 72.4 % (ref 43.0–77.0)
Platelets: 380 K/uL (ref 150.0–400.0)
RBC: 4.15 Mil/uL (ref 3.87–5.11)
RDW: 12.8 % (ref 11.5–15.5)
WBC: 8.5 K/uL (ref 4.0–10.5)

## 2024-02-14 LAB — COMPREHENSIVE METABOLIC PANEL WITH GFR
ALT: 13 U/L (ref 0–35)
AST: 18 U/L (ref 0–37)
Albumin: 4.2 g/dL (ref 3.5–5.2)
Alkaline Phosphatase: 84 U/L (ref 39–117)
BUN: 5 mg/dL — ABNORMAL LOW (ref 6–23)
CO2: 28 meq/L (ref 19–32)
Calcium: 9.4 mg/dL (ref 8.4–10.5)
Chloride: 102 meq/L (ref 96–112)
Creatinine, Ser: 0.75 mg/dL (ref 0.40–1.20)
GFR: 112.95 mL/min (ref >60.00)
Glucose, Bld: 91 mg/dL (ref 70–99)
Potassium: 4.1 meq/L (ref 3.5–5.1)
Sodium: 139 meq/L (ref 135–145)
Total Bilirubin: 0.5 mg/dL (ref 0.2–1.2)
Total Protein: 7.5 g/dL (ref 6.0–8.3)

## 2024-02-14 LAB — IBC + FERRITIN
Ferritin: 49.3 ng/mL (ref 10.0–291.0)
Iron: 42 ug/dL (ref 42–145)
Saturation Ratios: 12.3 % — ABNORMAL LOW (ref 20.0–50.0)
TIBC: 341.6 ug/dL (ref 250.0–450.0)
Transferrin: 244 mg/dL (ref 212.0–360.0)

## 2024-02-14 NOTE — Progress Notes (Signed)
 Established Patient Office Visit  Subjective:      CC:  Chief Complaint  Patient presents with   Acute Visit    Abdominal pain    HPI: Andrea Bender is a 23 y.o. female presenting on 02/14/2024 for Acute Visit (Abdominal pain) .  Discussed the use of AI scribe software for clinical note transcription with the patient, who gave verbal consent to proceed.  History of Present Illness Andrea Bender is a 23 year old female with recurrent C. difficile infection who presents with diarrhea and abdominal pain.  She initially experienced watery diarrhea on August 25th, which tested positive for C. difficile. After treatment with vancomycin , her symptoms resolved, but the diarrhea returned around September 30th, coinciding with her menstrual period. Currently, she experiences diarrhea up to five or six times a day, which has been somewhat regulated with Imodium. Her stools are watery and green or light brown, without blood or mucus.  She reports nausea, particularly when on the toilet, and significant abdominal bloating and gas, described as a 'bubbly, gas feeling.' She reports that her current abdominal pain is mostly in the upper abdomen, whereas during her initial C. difficile infection, the pain was in the lower abdomen. The pain causes discomfort when laughing or singing. The pain is associated with nausea, making it difficult for her to eat or even brush her teeth due to a sensitive gag reflex. She manages her symptoms by eating smaller meals and taking small bites to avoid gagging. No vomiting or fever, and she has not noticed any blood in her stool since the initial C. difficile infection was treated.  Her symptoms do not vary significantly with different foods, and she has tested negative for gluten intolerance and H. pylori. Her social history includes a long-standing issue with gas pain since childhood, which she managed during ballet by excusing herself to avoid discomfort from holding  in gas.         Social history:  Relevant past medical, surgical, family and social history reviewed and updated as indicated. Interim medical history since our last visit reviewed.  Allergies and medications reviewed and updated.  DATA REVIEWED: CHART IN EPIC     ROS: Negative unless specifically indicated above in HPI.    Current Outpatient Medications:    amphetamine -dextroamphetamine  (ADDERALL XR) 20 MG 24 hr capsule, Take 1 capsule (20 mg total) by mouth daily. With IR as needed in afternoons, Disp: 30 capsule, Rfl: 0   amphetamine -dextroamphetamine  (ADDERALL XR) 20 MG 24 hr capsule, Take 1 capsule (20 mg total) by mouth every morning., Disp: 30 capsule, Rfl: 0   [START ON 03/01/2024] amphetamine -dextroamphetamine  (ADDERALL XR) 20 MG 24 hr capsule, Take 1 capsule (20 mg total) by mouth every morning., Disp: 30 capsule, Rfl: 0   amphetamine -dextroamphetamine  (ADDERALL) 10 MG tablet, Take 1 tablet (10 mg total) by mouth daily as needed (afternoon dose as needed)., Disp: 40 tablet, Rfl: 0   Clindamycin-Benzoyl Per, Refr, gel, APPLY TO AFFECTED AREA EVERY DAY, Disp: , Rfl:    drospirenone -ethinyl estradiol  (YASMIN  28) 3-0.03 MG tablet, Take 1 tablet by mouth daily., Disp: 28 tablet, Rfl: 11   Multiple Vitamin (MULTIVITAMIN) tablet, Take 1 tablet by mouth daily., Disp: , Rfl:         Objective:        BP 102/64 (BP Location: Left Arm, Patient Position: Sitting, Cuff Size: Normal)   Pulse 96   Temp 99 F (37.2 C) (Temporal)   Ht 5' 9 (1.753 m)  Wt 141 lb 6.4 oz (64.1 kg)   LMP 02/05/2024 (Approximate)   SpO2 97%   BMI 20.88 kg/m   Physical Exam ABDOMEN: Abdomen tender to palpation with diffuse tenderness. Gas movement palpable.  Wt Readings from Last 3 Encounters:  02/14/24 141 lb 6.4 oz (64.1 kg)  01/01/24 144 lb 8 oz (65.5 kg)  08/09/23 148 lb 4 oz (67.2 kg)    Physical Exam Vitals reviewed.  Abdominal:     General: Bowel sounds are increased.      Palpations: Abdomen is soft.     Tenderness: There is generalized abdominal tenderness.     Comments: Work upper abdomen           Results LABS C. 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 difficile infection is suspected due to the return of symptoms including chronic diarrhea, abdominal pain, bloating, and nausea. Symptoms initially resolved with vancomycin  but have recurred, with diarrhea occurring 5-6 times daily before Imodium use. Abdominal pain has shifted from lower to upper abdomen, with tenderness on examination. No blood or mucus in stool currently, though past infection included blood in stool. Recurrent C. difficile is the primary concern. Risks of untreated infection include persistent symptoms and potential complications. Imodium use may be counterproductive if active infection is present as it may hinder expulsion of the pathogen. - Order stool test for C. difficile. - Initiate pulse dose of vancomycin  if C. difficile test is positive. - Draw liver function tests and white blood cell count. - Refer to gastroenterology for further evaluation. - Advise to submit stool sample promptly for testing.    Symptoms suspected to correlate with recurrent C diff, will retest for C diff and start pulse dose vancomycin : 125 mg 4 times daily for 10 to 14 days, then 125 mg twice daily for 7 days, then 125 mg once daily for 7 days, then 125 mg every 2 or 3 days for 2 to 8 weeks    Return in about 1 week (around 02/21/2024) for f/u diarrhea .     Ginger Patrick, MSN, APRN, FNP-C [facility]

## 2024-02-15 ENCOUNTER — Other Ambulatory Visit: Payer: Self-pay | Admitting: Radiology

## 2024-02-15 ENCOUNTER — Ambulatory Visit: Payer: Self-pay | Admitting: Family

## 2024-02-15 DIAGNOSIS — R197 Diarrhea, unspecified: Secondary | ICD-10-CM

## 2024-02-15 DIAGNOSIS — Z8619 Personal history of other infectious and parasitic diseases: Secondary | ICD-10-CM

## 2024-02-16 NOTE — Telephone Encounter (Signed)
 Please ask her the dates this note is needed for and then if you could send the note to her mychart.

## 2024-02-19 ENCOUNTER — Encounter: Payer: Self-pay | Admitting: Family

## 2024-02-19 LAB — GIARDIA ANTIGEN
MICRO NUMBER:: 17079043
RESULT:: NOT DETECTED
SPECIMEN QUALITY:: ADEQUATE

## 2024-02-19 LAB — GASTROINTESTINAL PATHOGEN PNL
CampyloBacter Group: NOT DETECTED
Norovirus GI/GII: NOT DETECTED
Rotavirus A: NOT DETECTED
Salmonella species: NOT DETECTED
Shiga Toxin 1: NOT DETECTED
Shiga Toxin 2: NOT DETECTED
Shigella Species: NOT DETECTED
Vibrio Group: NOT DETECTED
Yersinia enterocolitica: NOT DETECTED

## 2024-02-19 MED ORDER — VANCOMYCIN HCL 125 MG PO CAPS: ORAL_CAPSULE | ORAL | 0 refills | Status: AC

## 2024-02-20 ENCOUNTER — Other Ambulatory Visit: Payer: Self-pay | Admitting: Obstetrics & Gynecology

## 2024-02-20 DIAGNOSIS — N920 Excessive and frequent menstruation with regular cycle: Secondary | ICD-10-CM

## 2024-02-21 ENCOUNTER — Ambulatory Visit: Admitting: Family Medicine

## 2024-02-23 LAB — C. DIFFICILE GDH AND TOXIN A/B
GDH ANTIGEN: DETECTED
MICRO NUMBER:: 17079051
SPECIMEN QUALITY:: ADEQUATE
TOXIN A AND B: DETECTED

## 2024-02-23 LAB — CALPROTECTIN: Calprotectin: 2170 ug/g — ABNORMAL HIGH

## 2024-05-28 ENCOUNTER — Encounter: Payer: Self-pay | Admitting: Internal Medicine
